# Patient Record
Sex: Female | Born: 1960 | Race: White | Hispanic: No | Marital: Married | Smoking: Never smoker
Health system: Southern US, Community
[De-identification: ages and names within clinical notes are randomized; demographics above are authoritative.]

## PROBLEM LIST (undated history)

## (undated) DIAGNOSIS — M1990 Unspecified osteoarthritis, unspecified site: Secondary | ICD-10-CM

## (undated) DIAGNOSIS — R112 Nausea with vomiting, unspecified: Secondary | ICD-10-CM

## (undated) DIAGNOSIS — T8859XA Other complications of anesthesia, initial encounter: Secondary | ICD-10-CM

---

## 1985-06-22 HISTORY — PX: RHINOPLASTY: SUR1284

## 2013-04-12 DIAGNOSIS — J309 Allergic rhinitis, unspecified: Secondary | ICD-10-CM | POA: Insufficient documentation

## 2013-04-12 DIAGNOSIS — J3089 Other allergic rhinitis: Secondary | ICD-10-CM | POA: Insufficient documentation

## 2016-04-16 LAB — HM DEXA SCAN: HM Dexa Scan: NORMAL

## 2017-02-18 LAB — HM PAP SMEAR: HM Pap smear: NORMAL

## 2019-03-06 LAB — LIPID PANEL
Cholesterol: 239 — AB (ref 0–200)
HDL: 65 (ref 35–70)
LDL Cholesterol: 137
Triglycerides: 184 — AB (ref 40–160)

## 2019-03-06 LAB — VITAMIN D 25 HYDROXY (VIT D DEFICIENCY, FRACTURES): Vit D, 25-Hydroxy: 30.9

## 2019-03-06 LAB — CBC AND DIFFERENTIAL
HCT: 43 (ref 36–46)
Hemoglobin: 14.9 (ref 12.0–16.0)
Platelets: 254 (ref 150–399)
WBC: 7.3

## 2019-03-06 LAB — HEPATIC FUNCTION PANEL: Bilirubin, Direct: 0.1

## 2019-03-06 LAB — CBC: RBC: 4.61 (ref 3.87–5.11)

## 2019-03-16 LAB — BASIC METABOLIC PANEL
BUN: 21 (ref 4–21)
CO2: 28 — AB (ref 13–22)
Chloride: 105 (ref 99–108)
Creatinine: 0.9 (ref <=1.1)
Glucose: 98
Potassium: 4.2 (ref 3.4–5.3)
Sodium: 141 (ref 137–147)

## 2019-03-16 LAB — COMPREHENSIVE METABOLIC PANEL: Calcium: 9.7 (ref 8.7–10.7)

## 2019-05-17 LAB — HM MAMMOGRAPHY

## 2019-06-20 LAB — HM COLONOSCOPY

## 2020-03-20 ENCOUNTER — Ambulatory Visit: Payer: 59 | Admitting: Physician Assistant

## 2020-03-26 ENCOUNTER — Other Ambulatory Visit: Payer: 59

## 2020-03-26 ENCOUNTER — Ambulatory Visit (INDEPENDENT_AMBULATORY_CARE_PROVIDER_SITE_OTHER): Payer: 59 | Admitting: Physician Assistant

## 2020-03-26 ENCOUNTER — Other Ambulatory Visit: Payer: Self-pay

## 2020-03-26 ENCOUNTER — Encounter: Payer: Self-pay | Admitting: Physician Assistant

## 2020-03-26 VITALS — BP 126/80 | HR 82 | Temp 98.3°F | Ht 67.0 in | Wt 163.0 lb

## 2020-03-26 DIAGNOSIS — L819 Disorder of pigmentation, unspecified: Secondary | ICD-10-CM | POA: Diagnosis not present

## 2020-03-26 DIAGNOSIS — Z0000 Encounter for general adult medical examination without abnormal findings: Secondary | ICD-10-CM

## 2020-03-26 NOTE — Progress Notes (Signed)
Patient presents to clinic today to establish care. Would like CPE today if time allows.   Acute Concerns: Patient would like referral to a local Dermatologist. Has history of precancerous skin lesions so has a yearly skin examination.   Health Maintenance: Immunizations -- UTD per patient. Records requested.  Colonoscopy -- recall in 7 years. Mammogram -- 2020. Due for updated mammogram. Denies concerns. PAP -- UTD  History reviewed. No pertinent past medical history.  History reviewed. No pertinent surgical history.  Current Outpatient Medications on File Prior to Visit  Medication Sig Dispense Refill  . acyclovir (ZOVIRAX) 200 MG capsule Take by mouth.    . loratadine (CLARITIN) 10 MG tablet Take by mouth.    . naproxen sodium (ALEVE) 220 MG tablet Take by mouth.     No current facility-administered medications on file prior to visit.    No Known Allergies  Family History  Problem Relation Age of Onset  . Prostate cancer Father   . Breast cancer Maternal Grandmother 6    Social History   Socioeconomic History  . Marital status: Married    Spouse name: Not on file  . Number of children: Not on file  . Years of education: Not on file  . Highest education level: Not on file  Occupational History  . Not on file  Tobacco Use  . Smoking status: Never Smoker  . Smokeless tobacco: Never Used  Substance and Sexual Activity  . Alcohol use: Yes    Alcohol/week: 7.0 standard drinks    Types: 7 Glasses of wine per week  . Drug use: Never  . Sexual activity: Yes  Other Topics Concern  . Not on file  Social History Narrative  . Not on file   Social Determinants of Health   Financial Resource Strain:   . Difficulty of Paying Living Expenses: Not on file  Food Insecurity:   . Worried About Programme researcher, broadcasting/film/video in the Last Year: Not on file  . Ran Out of Food in the Last Year: Not on file  Transportation Needs:   . Lack of Transportation (Medical): Not on file  .  Lack of Transportation (Non-Medical): Not on file  Physical Activity:   . Days of Exercise per Week: Not on file  . Minutes of Exercise per Session: Not on file  Stress:   . Feeling of Stress : Not on file  Social Connections:   . Frequency of Communication with Friends and Family: Not on file  . Frequency of Social Gatherings with Friends and Family: Not on file  . Attends Religious Services: Not on file  . Active Member of Clubs or Organizations: Not on file  . Attends Banker Meetings: Not on file  . Marital Status: Not on file  Intimate Partner Violence:   . Fear of Current or Ex-Partner: Not on file  . Emotionally Abused: Not on file  . Physically Abused: Not on file  . Sexually Abused: Not on file   Review of Systems  Constitutional: Negative for fever and weight loss.  HENT: Negative for ear discharge, ear pain, hearing loss and tinnitus.   Eyes: Negative for blurred vision, double vision, photophobia and pain.  Respiratory: Negative for cough and shortness of breath.   Cardiovascular: Negative for chest pain and palpitations.  Gastrointestinal: Negative for abdominal pain, blood in stool, constipation, diarrhea, heartburn, melena, nausea and vomiting.  Genitourinary: Negative for dysuria, flank pain, frequency, hematuria and urgency.  Musculoskeletal: Negative for falls.  Neurological: Negative for dizziness, loss of consciousness and headaches.  Endo/Heme/Allergies: Negative for environmental allergies.  Psychiatric/Behavioral: Negative for depression, hallucinations, substance abuse and suicidal ideas. The patient is not nervous/anxious and does not have insomnia.    BP 126/80   Pulse 82   Temp 98.3 F (36.8 C) (Temporal)   Ht 5\' 7"  (1.702 m)   Wt 163 lb (73.9 kg)   SpO2 99%   BMI 25.53 kg/m   Physical Exam Vitals reviewed.  Constitutional:      Appearance: Normal appearance.  HENT:     Head: Normocephalic and atraumatic.     Right Ear: Tympanic  membrane, ear canal and external ear normal.     Left Ear: Tympanic membrane, ear canal and external ear normal.     Nose: Nose normal. No mucosal edema.     Mouth/Throat:     Pharynx: Uvula midline. No oropharyngeal exudate or posterior oropharyngeal erythema.  Eyes:     Conjunctiva/sclera: Conjunctivae normal.     Pupils: Pupils are equal, round, and reactive to light.  Neck:     Thyroid: No thyromegaly.  Cardiovascular:     Rate and Rhythm: Normal rate and regular rhythm.     Heart sounds: Normal heart sounds.  Pulmonary:     Effort: Pulmonary effort is normal. No respiratory distress.     Breath sounds: Normal breath sounds. No wheezing or rales.  Abdominal:     General: Bowel sounds are normal. There is no distension.     Palpations: Abdomen is soft. There is no mass.     Tenderness: There is no abdominal tenderness. There is no guarding or rebound.  Musculoskeletal:     Cervical back: Neck supple.  Lymphadenopathy:     Cervical: No cervical adenopathy.  Skin:    General: Skin is warm and dry.     Findings: No rash.  Neurological:     Mental Status: She is alert and oriented to person, place, and time.     Cranial Nerves: No cranial nerve deficit.    Assessment/Plan: 1. Visit for preventive health examination Depression screen negative. Health Maintenance reviewed. Preventive schedule discussed and handout given in AVS. Will obtain fasting labs today.  - CBC w/Diff; Future - Comprehensive metabolic panel; Future - Lipid panel; Future - Hemoglobin A1c; Future  This visit occurred during the SARS-CoV-2 public health emergency.  Safety protocols were in place, including screening questions prior to the visit, additional usage of staff PPE, and extensive cleaning of exam room while observing appropriate contact time as indicated for disinfecting solutions.      , PA-C

## 2020-03-26 NOTE — Patient Instructions (Signed)
Please go to the front desk to see about being scheduled for labs today at [facility].  Our office will call you with your results unless you have chosen to receive results via MyChart. If your blood work is normal we will follow-up each year for physicals and as scheduled for chronic medical problems. If anything is abnormal we will treat accordingly and get you in for a follow-up.  Consider a daily Tuermic supplement, 500 mg or less.  Continue use of Voltaren.  I have placed a referral for you to Dermatology. You should be contacted by specialist office within 7-10 days. If not, please call or send me a MyChart message.   It was very nice meeting you today. Welcome to [facility]!   Preventive Care 46-62 Years Old, Female Preventive care refers to visits with your health care provider and lifestyle choices that can promote health and wellness. This includes:  A yearly physical exam. This may also be called an annual well check.  Regular dental visits and eye exams.  Immunizations.  Screening for certain conditions.  Healthy lifestyle choices, such as eating a healthy diet, getting regular exercise, not using drugs or products that contain nicotine and tobacco, and limiting alcohol use. What can I expect for my preventive care visit? Physical exam Your health care provider will check your:  Height and weight. This may be used to calculate body mass index (BMI), which tells if you are at a healthy weight.  Heart rate and blood pressure.  Skin for abnormal spots. Counseling Your health care provider may ask you questions about your:  Alcohol, tobacco, and drug use.  Emotional well-being.  Home and relationship well-being.  Sexual activity.  Eating habits.  Work and work Statistician.  Method of birth control.  Menstrual cycle.  Pregnancy history. What immunizations do I need?  Influenza (flu) vaccine  This is recommended every year. Tetanus, diphtheria, and  pertussis (Tdap) vaccine  You may need a Td booster every 10 years. Varicella (chickenpox) vaccine  You may need this if you have not been vaccinated. Zoster (shingles) vaccine  You may need this after age 30. Measles, mumps, and rubella (MMR) vaccine  You may need at least one dose of MMR if you were born in 1957 or later. You may also need a second dose. Pneumococcal conjugate (PCV13) vaccine  You may need this if you have certain conditions and were not previously vaccinated. Pneumococcal polysaccharide (PPSV23) vaccine  You may need one or two doses if you smoke cigarettes or if you have certain conditions. Meningococcal conjugate (MenACWY) vaccine  You may need this if you have certain conditions. Hepatitis A vaccine  You may need this if you have certain conditions or if you travel or work in places where you may be exposed to hepatitis A. Hepatitis B vaccine  You may need this if you have certain conditions or if you travel or work in places where you may be exposed to hepatitis B. Haemophilus influenzae type b (Hib) vaccine  You may need this if you have certain conditions. Human papillomavirus (HPV) vaccine  If recommended by your health care provider, you may need three doses over 6 months. You may receive vaccines as individual doses or as more than one vaccine together in one shot (combination vaccines). Talk with your health care provider about the risks and benefits of combination vaccines. What tests do I need? Blood tests  Lipid and cholesterol levels. These may be checked every 5 years, or  more frequently if you are over 58 years old.  Hepatitis C test.  Hepatitis B test. Screening  Lung cancer screening. You may have this screening every year starting at age 33 if you have a 30-pack-year history of smoking and currently smoke or have quit within the past 15 years.  Colorectal cancer screening. All adults should have this screening starting at age 1 and  continuing until age 62. Your health care provider may recommend screening at age 3 if you are at increased risk. You will have tests every 1-10 years, depending on your results and the type of screening test.  Diabetes screening. This is done by checking your blood sugar (glucose) after you have not eaten for a while (fasting). You may have this done every 1-3 years.  Mammogram. This may be done every 1-2 years. Talk with your health care provider about when you should start having regular mammograms. This may depend on whether you have a family history of breast cancer.  BRCA-related cancer screening. This may be done if you have a family history of breast, ovarian, tubal, or peritoneal cancers.  Pelvic exam and Pap test. This may be done every 3 years starting at age 8. Starting at age 105, this may be done every 5 years if you have a Pap test in combination with an HPV test. Other tests  Sexually transmitted disease (STD) testing.  Bone density scan. This is done to screen for osteoporosis. You may have this scan if you are at high risk for osteoporosis. Follow these instructions at home: Eating and drinking  Eat a diet that includes fresh fruits and vegetables, whole grains, lean protein, and low-fat dairy.  Take vitamin and mineral supplements as recommended by your health care provider.  Do not drink alcohol if: ? Your health care provider tells you not to drink. ? You are pregnant, may be pregnant, or are planning to become pregnant.  If you drink alcohol: ? Limit how much you have to 0-1 drink a day. ? Be aware of how much alcohol is in your drink. In the [place] one drink equals one 12 oz bottle of beer (355 mL), one 5 oz glass of wine (148 mL), or one 1 oz glass of hard liquor (44 mL). Lifestyle  Take daily care of your teeth and gums.  Stay active. Exercise for at least 30 minutes on 5 or more days each week.  Do not use any products that contain nicotine or tobacco,  such as cigarettes, e-cigarettes, and chewing tobacco. If you need help quitting, ask your health care provider.  If you are sexually active, practice safe sex. Use a condom or other form of birth control (contraception) in order to prevent pregnancy and STIs (sexually transmitted infections).  If told by your health care provider, take low-dose aspirin daily starting at age 15. What's next?  Visit your health care provider once a year for a well check visit.  Ask your health care provider how often you should have your eyes and teeth checked.  Stay up to date on all vaccines. This information is not intended to replace advice given to you by your health care provider. Make sure you discuss any questions you have with your health care provider. Document Revised: 02/17/2018 Document Reviewed: 02/17/2018 Elsevier Patient Education  [employer]. .

## 2020-03-27 LAB — COMPREHENSIVE METABOLIC PANEL
AG Ratio: 2 (calc) (ref 1.0–2.5)
ALT: 12 U/L (ref 6–29)
AST: 15 U/L (ref 10–35)
Albumin: 4.7 g/dL (ref 3.6–5.1)
Alkaline phosphatase (APISO): 62 U/L (ref 37–153)
BUN: 14 mg/dL (ref 7–25)
CO2: 31 mmol/L (ref 20–32)
Calcium: 9.9 mg/dL (ref 8.6–10.4)
Chloride: 103 mmol/L (ref 98–110)
Creat: 0.72 mg/dL (ref 0.50–0.99)
Globulin: 2.4 g/dL (calc) (ref 1.9–3.7)
Glucose, Bld: 91 mg/dL (ref 65–99)
Potassium: 4.4 mmol/L (ref 3.5–5.3)
Sodium: 139 mmol/L (ref 135–146)
Total Bilirubin: 0.6 mg/dL (ref 0.2–1.2)
Total Protein: 7.1 g/dL (ref 6.1–8.1)

## 2020-03-27 LAB — LIPID PANEL
Cholesterol: 234 mg/dL — ABNORMAL HIGH (ref <200)
HDL: 63 mg/dL (ref >=50)
LDL Cholesterol (Calc): 141 mg/dL (calc) — ABNORMAL HIGH
Non-HDL Cholesterol (Calc): 171 mg/dL (calc) — ABNORMAL HIGH (ref <130)
Total CHOL/HDL Ratio: 3.7 (calc) (ref <5.0)
Triglycerides: 164 mg/dL — ABNORMAL HIGH (ref <150)

## 2020-03-27 LAB — CBC WITH DIFFERENTIAL/PLATELET
Absolute Monocytes: 630 cells/uL (ref 200–950)
Basophils Absolute: 69 cells/uL (ref 0–200)
Basophils Relative: 1.1 %
Eosinophils Absolute: 88 cells/uL (ref 15–500)
Eosinophils Relative: 1.4 %
HCT: 42 % (ref 35.0–45.0)
Hemoglobin: 14.2 g/dL (ref 11.7–15.5)
Lymphs Abs: 2003 cells/uL (ref 850–3900)
MCH: 31.8 pg (ref 27.0–33.0)
MCHC: 33.8 g/dL (ref 32.0–36.0)
MCV: 94.2 fL (ref 80.0–100.0)
MPV: 9.7 fL (ref 7.5–12.5)
Monocytes Relative: 10 %
Neutro Abs: 3509 cells/uL (ref 1500–7800)
Neutrophils Relative %: 55.7 %
Platelets: 340 10*3/uL (ref 140–400)
RBC: 4.46 10*6/uL (ref 3.80–5.10)
RDW: 11.3 % (ref 11.0–15.0)
Total Lymphocyte: 31.8 %
WBC: 6.3 10*3/uL (ref 3.8–10.8)

## 2020-03-27 LAB — HEMOGLOBIN A1C
Hgb A1c MFr Bld: 5 % of total Hgb (ref <5.7)
Mean Plasma Glucose: 97 (calc)
eAG (mmol/L): 5.4 (calc)

## 2020-03-28 ENCOUNTER — Encounter: Payer: Self-pay | Admitting: Physician Assistant

## 2020-04-02 ENCOUNTER — Encounter: Payer: Self-pay | Admitting: Emergency Medicine

## 2020-04-09 ENCOUNTER — Encounter: Payer: Self-pay | Admitting: Physician Assistant

## 2020-04-09 DIAGNOSIS — Z1231 Encounter for screening mammogram for malignant neoplasm of breast: Secondary | ICD-10-CM

## 2020-05-21 ENCOUNTER — Ambulatory Visit
Admission: RE | Admit: 2020-05-21 | Discharge: 2020-05-21 | Disposition: A | Discharge status: Home / Self Care | Payer: 59 | Source: Ambulatory Visit | Attending: Physician Assistant | Admitting: Physician Assistant

## 2020-05-21 ENCOUNTER — Other Ambulatory Visit: Payer: Self-pay

## 2020-05-21 DIAGNOSIS — Z1231 Encounter for screening mammogram for malignant neoplasm of breast: Secondary | ICD-10-CM

## 2020-06-13 ENCOUNTER — Other Ambulatory Visit: Payer: 59

## 2020-06-26 ENCOUNTER — Other Ambulatory Visit: Payer: Self-pay | Admitting: Physician Assistant

## 2020-06-26 ENCOUNTER — Encounter: Payer: Self-pay | Admitting: Physician Assistant

## 2020-06-26 ENCOUNTER — Other Ambulatory Visit: Payer: Self-pay

## 2020-06-26 ENCOUNTER — Telehealth (INDEPENDENT_AMBULATORY_CARE_PROVIDER_SITE_OTHER): Payer: 59 | Admitting: Physician Assistant

## 2020-06-26 VITALS — Temp 97.1°F

## 2020-06-26 DIAGNOSIS — J0100 Acute maxillary sinusitis, unspecified: Secondary | ICD-10-CM | POA: Diagnosis not present

## 2020-06-26 MED ORDER — AMOXICILLIN-POT CLAVULANATE 875-125 MG PO TABS: 1.0000 | ORAL_TABLET | Freq: Two times a day (BID) | ORAL | 0 refills | Status: DC

## 2020-06-26 MED ORDER — AMOXICILLIN-POT CLAVULANATE 875-125 MG PO TABS
1.0000 | ORAL_TABLET | Freq: Two times a day (BID) | ORAL | 0 refills | Status: DC
Start: 2020-06-26 — End: 2020-07-08

## 2020-06-26 MED ORDER — FLUTICASONE PROPIONATE 50 MCG/ACT NA SUSP: 2.0000 | Freq: Every day | NASAL | 0 refills | Status: DC

## 2020-06-26 NOTE — Progress Notes (Signed)
I have discussed the procedure for the virtual visit with the patient who has given consent to proceed with assessment and treatment.   Anabeth Chilcott S Zerah Hilyer, CMA     

## 2020-06-26 NOTE — Progress Notes (Signed)
   Virtual Visit via Video   I connected with patient on 06/26/20 at  9:30 AM EST by a video enabled telemedicine application and verified that I am speaking with the correct person using two identifiers.  Location patient: Home Location provider: [place] [facility], Office Persons participating in the virtual visit: Patient, Provider, CMA (Patina Moore)  I discussed the limitations of evaluation and management by telemedicine and the availability of in person appointments. The patient expressed understanding and agreed to proceed.  Subjective:   HPI:   Patient presents via Careg[facility] today c/o URI symptoms starting this past Saturday after playing golf in windy cold weather. Started with mild nasal congestion, post nasal drip and mild cough. Now with sinus pain, ear pain and tooth pain. Denies fevers, chills, malaise, loss of taste or smell.  Denies recent travel or sick contact.  Is up-to-date on Covid vaccinations.  Notes she did take a home test to be cautious and this was negative.  He is taking DayQuil and Claritin for symptoms with only some relief.   ROS:   See pertinent positives and negatives per HPI.  Patient Active Problem List   Diagnosis Date Noted  . Allergic rhinitis 04/12/2013    Social History   Tobacco Use  . Smoking status: Never Smoker  . Smokeless tobacco: Never Used  Substance Use Topics  . Alcohol use: Yes    Alcohol/week: 7.0 standard drinks    Types: 7 Glasses of wine per week    Current Outpatient Medications:  .  acyclovir (ZOVIRAX) 200 MG capsule, Take by mouth., Disp: , Rfl:  .  loratadine (CLARITIN) 10 MG tablet, Take by mouth., Disp: , Rfl:  .  naproxen sodium (ALEVE) 220 MG tablet, Take by mouth., Disp: , Rfl:   No Known Allergies  Objective:   Temp (!) 97.1 F (36.2 C) (Oral)   Patient is well-developed, well-nourished in no acute distress.  Resting comfortably at home.  Head is normocephalic, atraumatic.  No labored breathing.   Speech is clear and coherent with logical content.  Patient is alert and oriented at baseline.  + TTP maxillary sinuses bl.  Assessment and Plan:   1. Acute non-recurrent maxillary sinusitis After being outside in the very cold windy day.  No sick contacts.  No known Covid exposure.  Negative Covid test.  Symptoms are consistent with a maxillary sinusitis.  We will have her continue staying hydrated and getting plenty of rest.  Vitamin regimen reviewed for her start OTC.  Continue Claritin and NyQuil/DayQuil.  We will add on Augmentin and Flonase.  Patient also encouraged to start a saline nasal rinse.  Strict return precautions discussed with patient who voiced understanding and agreement with the plan.  A copy of instructions were sent to the patient's MyChart. - amoxicillin-clavulanate (AUGMENTIN) 875-125 MG tablet; Take 1 tablet by mouth 2 (two) times daily.  Dispense: 14 tablet; Refill: 0 - fluticasone (FLONASE) 50 MCG/ACT nasal spray; Place 2 sprays into both nostrils daily.  Dispense: 16 g; Refill: 0    Piedad Climes, PA-C 06/26/2020

## 2020-06-26 NOTE — Patient Instructions (Signed)
Instructions sent to patients MyChart.

## 2020-06-29 ENCOUNTER — Encounter: Payer: Self-pay | Admitting: Physician Assistant

## 2020-07-08 ENCOUNTER — Other Ambulatory Visit: Payer: Self-pay

## 2020-07-08 ENCOUNTER — Telehealth (INDEPENDENT_AMBULATORY_CARE_PROVIDER_SITE_OTHER): Payer: 59 | Admitting: Physician Assistant

## 2020-07-08 ENCOUNTER — Encounter: Payer: Self-pay | Admitting: Physician Assistant

## 2020-07-08 DIAGNOSIS — R300 Dysuria: Secondary | ICD-10-CM

## 2020-07-08 MED ORDER — FLUCONAZOLE 150 MG PO TABS: 150.0000 mg | ORAL_TABLET | Freq: Once | ORAL | 0 refills | Status: AC

## 2020-07-08 MED ORDER — NITROFURANTOIN MONOHYD MACRO 100 MG PO CAPS: 100.0000 mg | ORAL_CAPSULE | Freq: Two times a day (BID) | ORAL | 0 refills | Status: DC

## 2020-07-08 NOTE — Patient Instructions (Signed)
Instructions sent to patients MyChart.

## 2020-07-08 NOTE — Progress Notes (Signed)
   Virtual Visit via Video   I connected with patient on 07/08/20 at  8:30 AM EST by a video enabled telemedicine application and verified that I am speaking with the correct person using two identifiers.  Location patient: Home Location provider: [place] [facility], Office Persons participating in the virtual visit: Patient, Provider, CMA (Patina Moore)  I discussed the limitations of evaluation and management by telemedicine and the availability of in person appointments. The patient expressed understanding and agreed to proceed.  Subjective:   HPI:   Patient presents via Caregility today c/o 1 day of urinary urgency, frequency, hesitance and dysuria.. Denies hematuria, nausea/vomiting, back pain no flank pain. Denies fever.  Notes very mild vaginal itching. Has been trying to stay well-hydrated.   ROS:   See pertinent positives and negatives per HPI.  Patient Active Problem List   Diagnosis Date Noted  . Allergic rhinitis 04/12/2013    Social History   Tobacco Use  . Smoking status: Never Smoker  . Smokeless tobacco: Never Used  Substance Use Topics  . Alcohol use: Yes    Alcohol/week: 7.0 standard drinks    Types: 7 Glasses of wine per week    Current Outpatient Medications:  .  acyclovir (ZOVIRAX) 200 MG capsule, Take by mouth., Disp: , Rfl:  .  fluticasone (FLONASE) 50 MCG/ACT nasal spray, Place 2 sprays into both nostrils daily., Disp: 16 g, Rfl: 0 .  loratadine (CLARITIN) 10 MG tablet, Take by mouth., Disp: , Rfl:  .  naproxen sodium (ALEVE) 220 MG tablet, Take by mouth., Disp: , Rfl:   No Known Allergies  Objective:   There were no vitals taken for this visit.  Patient is well-developed, well-nourished in no acute distress.  Resting comfortably at home.  Head is normocephalic, atraumatic.  No labored breathing.  Speech is clear and coherent with logical content.  Patient is alert and oriented at baseline.   Assessment and Plan:   1. Dysuria Suspect  mild uncomplicated cystitis. Giving lack of alarm signs/symptoms will proceed with empiric treatment via video visit with Macrobid. Increase fluids. Rest. Avoid caffeine. Start probiotic. Will add one tablet of Diflucan giving itching and concern for mild yeast. Strict return precautions reviewed with patient.     Piedad Climes, PA-C 07/08/2020

## 2020-07-08 NOTE — Progress Notes (Signed)
I have discussed the procedure for the virtual visit with the patient who has given consent to proceed with assessment and treatment.   Zania Kalisz S Roe Wilner, CMA     

## 2020-07-10 ENCOUNTER — Ambulatory Visit: Payer: 59 | Admitting: Physician Assistant

## 2020-07-23 ENCOUNTER — Encounter: Payer: Self-pay | Admitting: Physician Assistant

## 2020-07-23 ENCOUNTER — Other Ambulatory Visit: Payer: Self-pay

## 2020-07-23 ENCOUNTER — Other Ambulatory Visit: Payer: 59

## 2020-07-23 ENCOUNTER — Telehealth (INDEPENDENT_AMBULATORY_CARE_PROVIDER_SITE_OTHER): Payer: 59 | Admitting: Physician Assistant

## 2020-07-23 ENCOUNTER — Encounter (INDEPENDENT_AMBULATORY_CARE_PROVIDER_SITE_OTHER): Payer: Self-pay

## 2020-07-23 VITALS — Temp 98.2°F

## 2020-07-23 DIAGNOSIS — R300 Dysuria: Secondary | ICD-10-CM

## 2020-07-23 LAB — POCT URINALYSIS DIPSTICK
Bilirubin, UA: NEGATIVE
Glucose, UA: NEGATIVE
Ketones, UA: NEGATIVE
Nitrite, UA: NEGATIVE
Protein, UA: NEGATIVE
Spec Grav, UA: 1.015 (ref 1.010–1.025)
Urobilinogen, UA: 0.2 E.U./dL
pH, UA: 7 (ref 5.0–8.0)

## 2020-07-23 NOTE — Progress Notes (Signed)
I have discussed the procedure for the virtual visit with the patient who has given consent to proceed with assessment and treatment.   Camyra Vaeth S Ezriel Boffa, CMA     

## 2020-07-23 NOTE — Progress Notes (Signed)
Patient ID: Theresa Rivera, female   DOB: 1961/04/11, 60 y.o.   MRN: [id number]   Virtual Visit via Video Note  I connected with Theresa Rivera on 07/23/20 at  8:00 AM EST by a video enabled telemedicine application and verified that I am speaking with the correct person using two identifiers.  Location: Patient: home Provider: office   I discussed the limitations of evaluation and management by telemedicine and the availability of in person appointments. The patient expressed understanding and agreed to proceed. Only the patient and myself were present for today's video call.   History of Present Illness:  Patient is a 60 year old female presenting via virtual video visit today for complaints of dysuria.  She was treated approximately 2 weeks ago by another video visit for similar symptoms with Macrobid.  She states that within 48 hours her symptoms had resolved after treatment that time.  Her symptoms just started again last night with burning, some hematuria, and frequency.   She denies any fever, back pain, abdominal pain, or nausea and vomiting. She did have some chills last night.  A urine specimen was not collected at last encounter.  Observations/Objective:   Gen: Awake, alert, no acute distress Resp: Breathing is even and non-labored Psych: calm/pleasant demeanor Neuro: Alert and Oriented x 3, + facial symmetry, speech is clear.   Assessment and Plan:  Dysuria -I suspect that she may have an unresolved acute cystitis.  I would like to obtain a urine sample for urinalysis and culture this time.  She is going to come by the office today and provide a sample.  Once the culture has been resulted then we will treat with antibiotics if needed.  She is going to take over-the-counter AZO and push fluids in the meantime.  She will seek treatment in our office if she acutely worsens.  Follow Up Instructions:    I discussed the assessment and treatment plan with the  patient. The patient was provided an opportunity to ask questions and all were answered. The patient agreed with the plan and demonstrated an understanding of the instructions.   The patient was advised to call back or seek an in-person evaluation if the symptoms worsen or if the condition fails to improve as anticipated.  Arianah Torgeson M Tatem Holsonback, PA-C

## 2020-07-23 NOTE — Patient Instructions (Signed)
I suspect that you may not have another bladder infection.  I will call with results when the culture is back.  Then we will treat with antibiotics if needed.  Please take the AZO and push fluids in the meantime.

## 2020-07-25 ENCOUNTER — Other Ambulatory Visit: Payer: Self-pay | Admitting: *Deleted

## 2020-07-25 LAB — URINE CULTURE
MICRO NUMBER:: 11481083
SPECIMEN QUALITY:: ADEQUATE

## 2020-07-25 MED ORDER — NITROFURANTOIN MONOHYD MACRO 100 MG PO CAPS: 100.0000 mg | ORAL_CAPSULE | Freq: Two times a day (BID) | ORAL | 0 refills | Status: DC

## 2021-01-24 ENCOUNTER — Encounter: Payer: 59 | Admitting: Family Medicine

## 2021-02-01 ENCOUNTER — Encounter: Payer: Self-pay | Admitting: Family Medicine

## 2021-02-03 ENCOUNTER — Other Ambulatory Visit: Payer: Self-pay

## 2021-02-03 DIAGNOSIS — E785 Hyperlipidemia, unspecified: Secondary | ICD-10-CM

## 2021-02-03 DIAGNOSIS — Z0000 Encounter for general adult medical examination without abnormal findings: Secondary | ICD-10-CM

## 2021-02-10 ENCOUNTER — Ambulatory Visit: Payer: 59 | Admitting: Family Medicine

## 2021-04-15 ENCOUNTER — Telehealth: Payer: Self-pay

## 2021-04-15 ENCOUNTER — Other Ambulatory Visit: Payer: Self-pay | Admitting: Family Medicine

## 2021-04-15 DIAGNOSIS — Z1231 Encounter for screening mammogram for malignant neoplasm of breast: Secondary | ICD-10-CM

## 2021-04-15 NOTE — Telephone Encounter (Signed)
Pt called wanting labs done prior to her appt for 10/28. Thanvi is scheduled for a TOC with Jimmey Ralph and is also requesting a physical. Can labs be ordered for 10/27? Pleas Advise.

## 2021-04-16 NOTE — Telephone Encounter (Signed)
Unable to do placed order, Need to have a TOC first

## 2021-04-17 ENCOUNTER — Encounter: Payer: Self-pay | Admitting: Family Medicine

## 2021-04-17 NOTE — Telephone Encounter (Signed)
Pt is aware.  

## 2021-04-18 ENCOUNTER — Other Ambulatory Visit: Payer: Self-pay

## 2021-04-18 ENCOUNTER — Ambulatory Visit (INDEPENDENT_AMBULATORY_CARE_PROVIDER_SITE_OTHER): Payer: 59 | Admitting: Family Medicine

## 2021-04-18 ENCOUNTER — Encounter: Payer: Self-pay | Admitting: Family Medicine

## 2021-04-18 VITALS — BP 127/80 | HR 75 | Temp 98.3°F | Ht 67.0 in | Wt 165.2 lb

## 2021-04-18 DIAGNOSIS — E785 Hyperlipidemia, unspecified: Secondary | ICD-10-CM | POA: Diagnosis not present

## 2021-04-18 DIAGNOSIS — B001 Herpesviral vesicular dermatitis: Secondary | ICD-10-CM | POA: Diagnosis not present

## 2021-04-18 DIAGNOSIS — Z0001 Encounter for general adult medical examination with abnormal findings: Secondary | ICD-10-CM

## 2021-04-18 DIAGNOSIS — R739 Hyperglycemia, unspecified: Secondary | ICD-10-CM | POA: Diagnosis not present

## 2021-04-18 DIAGNOSIS — E663 Overweight: Secondary | ICD-10-CM

## 2021-04-18 DIAGNOSIS — Z6825 Body mass index (BMI) 25.0-25.9, adult: Secondary | ICD-10-CM

## 2021-04-18 LAB — HEMOGLOBIN A1C: Hgb A1c MFr Bld: 5.3 % (ref 4.6–6.5)

## 2021-04-18 LAB — COMPREHENSIVE METABOLIC PANEL
ALT: 17 U/L (ref 0–35)
AST: 20 U/L (ref 0–37)
Albumin: 4.7 g/dL (ref 3.5–5.2)
Alkaline Phosphatase: 58 U/L (ref 39–117)
BUN: 14 mg/dL (ref 6–23)
CO2: 31 mEq/L (ref 19–32)
Calcium: 10 mg/dL (ref 8.4–10.5)
Chloride: 103 mEq/L (ref 96–112)
Creatinine, Ser: 0.91 mg/dL (ref 0.40–1.20)
GFR: 67.77 mL/min (ref >60.00)
Glucose, Bld: 100 mg/dL — ABNORMAL HIGH (ref 70–99)
Potassium: 5.2 mEq/L — ABNORMAL HIGH (ref 3.5–5.1)
Sodium: 141 mEq/L (ref 135–145)
Total Bilirubin: 0.9 mg/dL (ref 0.2–1.2)
Total Protein: 7.7 g/dL (ref 6.0–8.3)

## 2021-04-18 LAB — CBC
HCT: 42.6 % (ref 36.0–46.0)
Hemoglobin: 14.7 g/dL (ref 12.0–15.0)
MCHC: 34.6 g/dL (ref 30.0–36.0)
MCV: 92.6 fl (ref 78.0–100.0)
Platelets: 290 10*3/uL (ref 150.0–400.0)
RBC: 4.6 Mil/uL (ref 3.87–5.11)
RDW: 12.1 % (ref 11.5–15.5)
WBC: 5.4 10*3/uL (ref 4.0–10.5)

## 2021-04-18 LAB — LIPID PANEL
Cholesterol: 229 mg/dL — ABNORMAL HIGH (ref 0–200)
HDL: 64.5 mg/dL (ref >39.00)
LDL Cholesterol: 147 mg/dL — ABNORMAL HIGH (ref 0–99)
NonHDL: 164.7
Total CHOL/HDL Ratio: 4
Triglycerides: 90 mg/dL (ref 0.0–149.0)
VLDL: 18 mg/dL (ref 0.0–40.0)

## 2021-04-18 LAB — TSH: TSH: 1.33 u[IU]/mL (ref 0.35–5.50)

## 2021-04-18 MED ORDER — ACYCLOVIR 200 MG PO CAPS: 800.0000 mg | ORAL_CAPSULE | Freq: Two times a day (BID) | ORAL | 1 refills | Status: AC

## 2021-04-18 NOTE — Assessment & Plan Note (Signed)
We will refill acyclovir.  She uses as needed for outbreaks.

## 2021-04-18 NOTE — Assessment & Plan Note (Signed)
Check labs.  Continue lifestyle modifications. °

## 2021-04-18 NOTE — Patient Instructions (Signed)
It was very nice to see you today!  We will check blood work.  Keep up on diet and exercise.  We will see back in year for your next physical.  Please come back to see [place] sooner if needed.   Take care, Dr Jerline Pain  PLEASE NOTE:  If you had any lab tests please let us know if you have not heard back within a few days. You may see your results on mychart before we have a chance to review them but we will give you a call once they are reviewed by [place]. If we ordered any referrals today, please let us know if you have not heard from their office within the next week.   Please try these tips to maintain a healthy lifestyle:  Eat at least 3 REAL meals and 1-2 snacks per day.  Aim for no more than 5 hours between eating.  If you eat breakfast, please do so within one hour of getting up.   Each meal should contain half fruits/vegetables, one quarter protein, and one quarter carbs (no bigger than a computer mouse)  Cut down on sweet beverages. This includes juice, soda, and sweet tea.   Drink at least 1 glass of water with each meal and aim for at least 8 glasses per day  Exercise at least 150 minutes every week.    Preventive Care 23-26 Years Old, Female Preventive care refers to lifestyle choices and visits with your health care provider that can promote health and wellness. This includes: A yearly physical exam. This is also called an annual wellness visit. Regular dental and eye exams. Immunizations. Screening for certain conditions. Healthy lifestyle choices, such as: Eating a healthy diet. Getting regular exercise. Not using drugs or products that contain nicotine and tobacco. Limiting alcohol use. What can I expect for my preventive care visit? Physical exam Your health care provider will check your: Height and weight. These may be used to calculate your BMI (body mass index). BMI is a measurement that tells if you are at a healthy weight. Heart rate and blood pressure. Body  temperature. Skin for abnormal spots. Counseling Your health care provider may ask you questions about your: Past medical problems. Family's medical history. Alcohol, tobacco, and drug use. Emotional well-being. Home life and relationship well-being. Sexual activity. Diet, exercise, and sleep habits. Work and work Statistician. Access to firearms. Method of birth control. Menstrual cycle. Pregnancy history. What immunizations do I need? Vaccines are usually given at various ages, according to a schedule. Your health care provider will recommend vaccines for you based on your age, medical history, and lifestyle or other factors, such as travel or where you work. What tests do I need? Blood tests Lipid and cholesterol levels. These may be checked every 5 years, or more often if you are over 38 years old. Hepatitis C test. Hepatitis B test. Screening Lung cancer screening. You may have this screening every year starting at age 14 if you have a 30-pack-year history of smoking and currently smoke or have quit within the past 15 years. Colorectal cancer screening. All adults should have this screening starting at age 26 and continuing until age 58. Your health care provider may recommend screening at age 21 if you are at increased risk. You will have tests every 1-10 years, depending on your results and the type of screening test. Diabetes screening. This is done by checking your blood sugar (glucose) after you have not eaten for a while (fasting). You  may have this done every 1-3 years. Mammogram. This may be done every 1-2 years. Talk with your health care provider about when you should start having regular mammograms. This may depend on whether you have a family history of breast cancer. BRCA-related cancer screening. This may be done if you have a family history of breast, ovarian, tubal, or peritoneal cancers. Pelvic exam and Pap test. This may be done every 3 years starting at age  63. Starting at age 70, this may be done every 5 years if you have a Pap test in combination with an HPV test. Other tests STD (sexually transmitted disease) testing, if you are at risk. Bone density scan. This is done to screen for osteoporosis. You may have this scan if you are at high risk for osteoporosis. Talk with your health care provider about your test results, treatment options, and if necessary, the need for more tests. Follow these instructions at home: Eating and drinking  Eat a diet that includes fresh fruits and vegetables, whole grains, lean protein, and low-fat dairy products. Take vitamin and mineral supplements as recommended by your health care provider. Do not drink alcohol if: Your health care provider tells you not to drink. You are pregnant, may be pregnant, or are planning to become pregnant. If you drink alcohol: Limit how much you have to 0-1 drink a day. Be aware of how much alcohol is in your drink. In the [place] one drink equals one 12 oz bottle of beer (355 mL), one 5 oz glass of wine (148 mL), or one 1 oz glass of hard liquor (44 mL). Lifestyle Take daily care of your teeth and gums. Brush your teeth every morning and night with fluoride toothpaste. Floss one time each day. Stay active. Exercise for at least 30 minutes 5 or more days each week. Do not use any products that contain nicotine or tobacco, such as cigarettes, e-cigarettes, and chewing tobacco. If you need help quitting, ask your health care provider. Do not use drugs. If you are sexually active, practice safe sex. Use a condom or other form of protection to prevent STIs (sexually transmitted infections). If you do not wish to become pregnant, use a form of birth control. If you plan to become pregnant, see your health care provider for a prepregnancy visit. If told by your health care provider, take low-dose aspirin daily starting at age 33. Find healthy ways to cope with stress, such  as: Meditation, yoga, or listening to music. Journaling. Talking to a trusted person. Spending time with friends and family. Safety Always wear your seat belt while driving or riding in a vehicle. Do not drive: If you have been drinking alcohol. Do not ride with someone who has been drinking. When you are tired or distracted. While texting. Wear a helmet and other protective equipment during sports activities. If you have firearms in your house, make sure you follow all gun safety procedures. What's next? Visit your health care provider once a year for an annual wellness visit. Ask your health care provider how often you should have your eyes and teeth checked. Stay up to date on all vaccines. This information is not intended to replace advice given to you by your health care provider. Make sure you discuss any questions you have with your health care provider. Document Revised: 08/16/2020 Document Reviewed: 02/17/2018 Elsevier Patient Education  2022 [employer].

## 2021-04-18 NOTE — Progress Notes (Signed)
Chief Complaint:  Theresa Rivera is a 60 y.o. female who presents today for her annual comprehensive physical exam.    Assessment/Plan:  Chronic Problems Addressed Today: Dyslipidemia Check labs.  Continue lifestyle modifications.   Cold sore We will refill acyclovir.  She uses as needed for outbreaks.  Body mass index is 25.87 kg/m. / Overweight  BMI Metric Follow Up - 04/18/21 0902       BMI Metric Follow Up-Please document annually   BMI Metric Follow Up Education provided             Preventative Healthcare: Check Labs. Pap next year. UTD on flu and covid vaccines. Will check with pharmacy for shingles vaccines.   Patient Counseling(The following topics were reviewed and/or handout was given):  -Nutrition: Stressed importance of moderation in sodium/caffeine intake, saturated fat and cholesterol, caloric balance, sufficient intake of fresh fruits, vegetables, and fiber.  -Stressed the importance of regular exercise.   -Substance Abuse: Discussed cessation/primary prevention of tobacco, alcohol, or other drug use; driving or other dangerous activities under the influence; availability of treatment for abuse.   -Injury prevention: Discussed safety belts, safety helmets, smoke detector, smoking near bedding or upholstery.   -Sexuality: Discussed sexually transmitted diseases, partner selection, use of condoms, avoidance of unintended pregnancy and contraceptive alternatives.   -Dental health: Discussed importance of regular tooth brushing, flossing, and dental visits.  -Health maintenance and immunizations reviewed. Please refer to Health maintenance section.  Return to care in 1 year for next preventative visit.     Subjective:  HPI:  She has no acute complaints today.    Cataracts were also diagnosed, after she went to a checkup to see about changes in her vision.  A few months ago her husband suffered a rotator cuff injury which has impacted her  physical activity.  There also have been a couple UTI's this year.   Lifestyle Diet: Reasonably healthy diet.  Exercise: Plays golf, does yardwork.   Depression screen PHQ 2/9 04/18/2021  Decreased Interest 0  Down, Depressed, Hopeless 0  PHQ - 2 Score 0    Health Maintenance Due  Topic Date Due   HIV Screening  Never done   Hepatitis C Screening  Never done   Zoster Vaccines- Shingrix (1 of 2) Never done   COVID-19 Vaccine (5 - Booster for Pfizer series) 11/14/2020   INFLUENZA VACCINE  01/20/2021     ROS: Per HPI, otherwise a complete review of systems was negative.   PMH:  The following were reviewed and entered/updated in epic: History reviewed. No pertinent past medical history. Patient Active Problem List   Diagnosis Date Noted   Cold sore 04/18/2021   Dyslipidemia 04/18/2021   Allergic rhinitis 04/12/2013   History reviewed. No pertinent surgical history.  Family History  Problem Relation Age of Onset   Prostate cancer Father    Breast cancer Maternal Grandmother 76    Medications- reviewed and updated Current Outpatient Medications  Medication Sig Dispense Refill   naproxen sodium (ALEVE) 220 MG tablet Take by mouth.     acyclovir (ZOVIRAX) 200 MG capsule Take 4 capsules (800 mg total) by mouth 2 (two) times daily. Take for 5 days as needed for flares 90 capsule 1   No current facility-administered medications for this visit.    Allergies-reviewed and updated No Known Allergies  Social History   Socioeconomic History   Marital status: Married    Spouse name: Not on file   Number of children:  Not on file   Years of education: Not on file   Highest education level: Not on file  Occupational History   Not on file  Tobacco Use   Smoking status: Never   Smokeless tobacco: Never  Substance and Sexual Activity   Alcohol use: Yes    Alcohol/week: 7.0 standard drinks    Types: 7 Glasses of wine per week   Drug use: Never   Sexual activity: Yes   Other Topics Concern   Not on file  Social History Narrative   Not on file   Social Determinants of Health   Financial Resource Strain: Not on file  Food Insecurity: Not on file  Transportation Needs: Not on file  Physical Activity: Not on file  Stress: Not on file  Social Connections: Not on file        Objective:  Physical Exam: BP 127/80   Pulse 75   Temp 98.3 F (36.8 C) (Temporal)   Ht 5\' 7"  (1.702 m)   Wt 165 lb 3.2 oz (74.9 kg)   SpO2 99%   BMI 25.87 kg/m   Body mass index is 25.87 kg/m. Wt Readings from Last 3 Encounters:  04/18/21 165 lb 3.2 oz (74.9 kg)  03/26/20 163 lb (73.9 kg)   Gen: NAD, resting comfortably HEENT: TMs normal bilaterally. OP clear. No thyromegaly noted.  CV: RRR with no murmurs appreciated Pulm: NWOB, CTAB with no crackles, wheezes, or rhonchi GI: Normal bowel sounds present. Soft, Nontender, Nondistended. MSK: no edema, cyanosis, or clubbing noted Skin: warm, dry Neuro: CN2-12 grossly intact. Strength 5/5 in upper and lower extremities. Reflexes symmetric and intact bilaterally.  Psych: Normal affect and thought content     I,Jordan Kelly,acting as a scribe for 05/26/20, MD.,have documented all relevant documentation on the behalf of [name], MD,as directed by  [name], MD while in the presence of [name], MD.  I, [name], MD, have reviewed all documentation for this visit. The documentation on 04/18/21 for the exam, diagnosis, procedures, and orders are all accurate and complete.  04/20/21. Katina Degree, MD 04/18/2021 9:02 AM

## 2021-04-21 NOTE — Progress Notes (Signed)
Please inform patient of the following:  Cholesterol is borderline but everything else is STABLE. Do not need to make any changes to her treatment plan at this time. We can recheck in a year.

## 2021-04-22 NOTE — Telephone Encounter (Signed)
Lab done on appt day

## 2021-04-24 ENCOUNTER — Encounter: Payer: Self-pay | Admitting: Family Medicine

## 2021-05-22 ENCOUNTER — Ambulatory Visit
Admission: RE | Admit: 2021-05-22 | Discharge: 2021-05-22 | Disposition: A | Discharge status: Home / Self Care | Payer: 59 | Source: Ambulatory Visit | Attending: Family Medicine | Admitting: Family Medicine

## 2021-05-22 DIAGNOSIS — Z1231 Encounter for screening mammogram for malignant neoplasm of breast: Secondary | ICD-10-CM

## 2021-11-14 ENCOUNTER — Encounter (HOSPITAL_BASED_OUTPATIENT_CLINIC_OR_DEPARTMENT_OTHER): Payer: Self-pay

## 2021-11-14 ENCOUNTER — Other Ambulatory Visit (HOSPITAL_BASED_OUTPATIENT_CLINIC_OR_DEPARTMENT_OTHER): Payer: Self-pay

## 2021-11-14 MED ORDER — GATIFLOXACIN 0.5 % OP SOLN
OPHTHALMIC | 1 refills | Status: DC
  Filled 2021-11-14: qty 2.5, 16d supply, fill #0
  Filled 2021-11-19: qty 5, 24d supply, fill #0
  Filled 2021-12-19: qty 5, 24d supply, fill #1

## 2021-11-14 MED ORDER — KETOROLAC TROMETHAMINE 0.5 % OP SOLN
OPHTHALMIC | 1 refills | Status: DC
  Filled 2021-11-14 – 2021-11-19 (×2): qty 5, 25d supply, fill #0

## 2021-11-14 MED ORDER — DIFLUPREDNATE 0.05 % OP EMUL
OPHTHALMIC | 1 refills | Status: DC
  Filled 2021-11-14: qty 5, 25d supply, fill #0

## 2021-11-18 ENCOUNTER — Other Ambulatory Visit (HOSPITAL_BASED_OUTPATIENT_CLINIC_OR_DEPARTMENT_OTHER): Payer: Self-pay

## 2021-11-19 ENCOUNTER — Other Ambulatory Visit (HOSPITAL_BASED_OUTPATIENT_CLINIC_OR_DEPARTMENT_OTHER): Payer: Self-pay

## 2021-12-05 ENCOUNTER — Ambulatory Visit: Payer: 59 | Attending: Internal Medicine

## 2021-12-05 ENCOUNTER — Other Ambulatory Visit (HOSPITAL_BASED_OUTPATIENT_CLINIC_OR_DEPARTMENT_OTHER): Payer: Self-pay

## 2021-12-05 DIAGNOSIS — Z23 Encounter for immunization: Secondary | ICD-10-CM

## 2021-12-05 MED ORDER — PFIZER COVID-19 VAC BIVALENT 30 MCG/0.3ML IM SUSP
INTRAMUSCULAR | 0 refills | Status: DC
  Filled 2021-12-05: qty 0.3, 1d supply, fill #0

## 2021-12-05 NOTE — Progress Notes (Signed)
   Covid-19 Vaccination Clinic  Name:  Theresa Rivera    MRN: [id number] DOB: July 14, 1960  12/05/2021  [name] was observed post Covid-19 immunization for 15 minutes without incident. She was provided with Vaccine Information Sheet and instruction to access the V-Safe system.   [name] was instructed to call 911 with any severe reactions post vaccine: Difficulty breathing  Swelling of face and throat  A fast heartbeat  A bad rash all over body  Dizziness and weakness   Immunizations Administered     Name Date Dose VIS Date Route   Pfizer Covid-19 Vaccine Bivalent Booster 12/05/2021 11:30 AM 0.3 mL 02/19/2021 Intramuscular   Manufacturer: [employer], [employer]   Lot: [id number]   NDC: [phone]

## 2021-12-09 ENCOUNTER — Encounter: Payer: Self-pay | Admitting: Family Medicine

## 2021-12-19 ENCOUNTER — Other Ambulatory Visit (HOSPITAL_BASED_OUTPATIENT_CLINIC_OR_DEPARTMENT_OTHER): Payer: Self-pay

## 2022-02-22 ENCOUNTER — Encounter: Payer: Self-pay | Admitting: Family Medicine

## 2022-02-24 NOTE — Telephone Encounter (Signed)
Spoke with patient, stated symptoms stared on Saturday tested positive on Sunday 02/22/2022. Today feeling fatigue and elevated temperature  Advise to schedule a virtual appointment, patient stated will see how she is feeling by tomorrow and if symptoms worsen will call for appointment Or use virtual visit on mychart

## 2022-03-15 ENCOUNTER — Encounter: Payer: Self-pay | Admitting: Family Medicine

## 2022-03-16 ENCOUNTER — Encounter: Payer: Self-pay | Admitting: *Deleted

## 2022-03-16 NOTE — Telephone Encounter (Signed)
Please advise 

## 2022-03-17 NOTE — Telephone Encounter (Signed)
Recommend to wait at least 4 months before getting the booster. It is ok for her to get all the vaccines at the same time.  Algis Greenhouse. Jerline Pain, MD 03/17/2022 7:29 AM

## 2022-03-24 ENCOUNTER — Other Ambulatory Visit (HOSPITAL_BASED_OUTPATIENT_CLINIC_OR_DEPARTMENT_OTHER): Payer: Self-pay

## 2022-03-24 ENCOUNTER — Telehealth: Payer: 59 | Admitting: Physician Assistant

## 2022-03-24 DIAGNOSIS — L239 Allergic contact dermatitis, unspecified cause: Secondary | ICD-10-CM

## 2022-03-24 MED ORDER — PREDNISONE 10 MG PO TABS
ORAL_TABLET | ORAL | 0 refills | Status: AC
  Filled 2022-03-24: qty 37, 14d supply, fill #0

## 2022-03-24 NOTE — Progress Notes (Signed)
I have spent 5 minutes in review of e-visit questionnaire, review and updating patient chart, medical decision making and response to patient.   Duy Lemming Cody Layci Stenglein, PA-C    

## 2022-03-24 NOTE — Progress Notes (Signed)
Message sent to patient requesting further input regarding current symptoms. Awaiting patient response.  

## 2022-03-24 NOTE — Progress Notes (Signed)
E Visit for Rash  We are sorry that you are not feeling well. Here is how we plan to help!  Based on what you shared with me it looks like you have contact dermatitis.  Contact dermatitis is a skin rash caused by something that touches the skin and causes irritation or inflammation.  Your skin may be red, swollen, dry, cracked, and itch.  The rash should go away in a few days but can last a few weeks.  If you get a rash, it's important to figure out what caused it so the irritant can be avoided in the future. and I am prescribing a two week course of steroids (37 tablets of 10 mg prednisone).  Days 1-4 take 4 tablets (40 mg) daily  Days 5-8 take 3 tablets (30 mg) daily, Days 9-11 take 2 tablets (20 mg) daily, Days 12-14 take 1 tablet (10 mg) daily.       HOME CARE:  Take cool showers and avoid direct sunlight. Apply cool compress or wet dressings. Take a bath in an oatmeal bath.  Sprinkle content of one Aveeno packet under running faucet with comfortably warm water.  Bathe for 15-20 minutes, 1-2 times daily.  Pat dry with a towel. Do not rub the rash. Use hydrocortisone cream. Take an antihistamine like Benadryl for widespread rashes that itch.  The adult dose of Benadryl is 25-50 mg by mouth 4 times daily. Caution:  This type of medication may cause sleepiness.  Do not drink alcohol, drive, or operate dangerous machinery while taking antihistamines.  Do not take these medications if you have prostate enlargement.  Read package instructions thoroughly on all medications that you take.  GET HELP RIGHT AWAY IF:  Symptoms don't go away after treatment. Severe itching that persists. If you rash spreads or swells. If you rash begins to smell. If it blisters and opens or develops a yellow-brown crust. You develop a fever. You have a sore throat. You become short of breath.  MAKE SURE YOU:  Understand these instructions. Will watch your condition. Will get help right away if you are not  doing well or get worse.  Thank you for choosing an e-visit.  Your e-visit answers were reviewed by a board certified advanced clinical practitioner to complete your personal care plan. Depending upon the condition, your plan could have included both over the counter or prescription medications.  Please review your pharmacy choice. Make sure the pharmacy is open so you can pick up prescription now. If there is a problem, you may contact your provider through MyChart messaging and have the prescription routed to another pharmacy.  Your safety is important to us. If you have drug allergies check your prescription carefully.   For the next 24 hours you can use MyChart to ask questions about today's visit, request a non-urgent call back, or ask for a work or school excuse. You will get an email in the next two days asking about your experience. I hope that your e-visit has been valuable and will speed your recovery.  

## 2022-04-08 ENCOUNTER — Other Ambulatory Visit (HOSPITAL_BASED_OUTPATIENT_CLINIC_OR_DEPARTMENT_OTHER): Payer: Self-pay

## 2022-04-08 ENCOUNTER — Encounter: Payer: Self-pay | Admitting: Family Medicine

## 2022-04-08 ENCOUNTER — Ambulatory Visit (INDEPENDENT_AMBULATORY_CARE_PROVIDER_SITE_OTHER): Payer: 59 | Admitting: Family Medicine

## 2022-04-08 VITALS — BP 123/85 | HR 69 | Temp 97.3°F | Ht 67.0 in | Wt 157.2 lb

## 2022-04-08 DIAGNOSIS — R739 Hyperglycemia, unspecified: Secondary | ICD-10-CM | POA: Diagnosis not present

## 2022-04-08 DIAGNOSIS — Z0001 Encounter for general adult medical examination with abnormal findings: Secondary | ICD-10-CM

## 2022-04-08 DIAGNOSIS — E785 Hyperlipidemia, unspecified: Secondary | ICD-10-CM | POA: Diagnosis not present

## 2022-04-08 DIAGNOSIS — R21 Rash and other nonspecific skin eruption: Secondary | ICD-10-CM | POA: Diagnosis not present

## 2022-04-08 DIAGNOSIS — J309 Allergic rhinitis, unspecified: Secondary | ICD-10-CM | POA: Diagnosis not present

## 2022-04-08 MED ORDER — TRIAMCINOLONE ACETONIDE 0.5 % EX OINT
1.0000 | TOPICAL_OINTMENT | Freq: Two times a day (BID) | CUTANEOUS | 0 refills | Status: DC
  Filled 2022-04-08: qty 15, 30d supply, fill #0

## 2022-04-08 MED ORDER — HYDROXYZINE HCL 50 MG PO TABS
50.0000 mg | ORAL_TABLET | Freq: Three times a day (TID) | ORAL | 0 refills | Status: DC | PRN
  Filled 2022-04-08: qty 90, 30d supply, fill #0

## 2022-04-08 NOTE — Assessment & Plan Note (Signed)
Overall stable.  She will be doubling up on anti histamine as above.

## 2022-04-08 NOTE — Assessment & Plan Note (Signed)
She will be coming back soon for CPE.  We will check lipids prior to her CPE.

## 2022-04-08 NOTE — Addendum Note (Signed)
Addended by: Betti Cruz on: 04/08/2022 08:40 AM   Modules accepted: Orders

## 2022-04-08 NOTE — Progress Notes (Signed)
   Theresa Rivera is a 61 y.o. female who presents today for an office visit.  Assessment/Plan:  New/Acute Problems: Rash Consistent with urticaria.  Possibly precipitated by vaccines.  No other new obvious exposures.  Recommend she double up on her second-generation antihistamine.  She can try using a combination or taking a double dose.  We will also start hydroxyzine 50 mg nightly as well as topical triamcinolone. She will let me know if not improving in the next 1 to 2 weeks.  We will consider addition of singulair versus referral to dermatology.   Chronic Problems Addressed Today: Dyslipidemia She will be coming back soon for CPE.  We will check lipids prior to her CPE.  Allergic rhinitis Overall stable.  She will be doubling up on anti histamine as above.     Subjective:  HPI:  Patient here with concern for rash.  Symptoms started about 3 weeks ago. Rash is predominantly located on right mid back.  Very itchy.  She does note that she had 3 separate vaccines 2 days before the rash outbreak with flu, Tdap, and RSV.  She tried topical cortisone cream and Benadryl with modest improvement.  Had a virtual visit a couple weeks ago and was started on prednisone taper which did not give much relief.  She took over-the-counter dose of Xyzal without any improvement.  No obvious new exposures or detergents.  Nobody at home has had similar issues.  Rash has begun to progress and involve her neck and bilateral axilla as well.        Objective:  Physical Exam: BP 123/85   Pulse 69   Temp (!) 97.3 F (36.3 C) (Temporal)   Ht 5\' 7"  (1.702 m)   Wt 157 lb 3.2 oz (71.3 kg)   SpO2 99%   BMI 24.62 kg/m   Gen: No acute distress, resting comfortably Skin: Urticarial appearing rash on right mid back with erythema and papules. Neuro: Grossly normal, moves all extremities Psych: Normal affect and thought content      Theresa Hawkey M. Jerline Pain, MD 04/08/2022 7:52 AM

## 2022-04-14 ENCOUNTER — Encounter: Payer: Self-pay | Admitting: Family Medicine

## 2022-04-14 NOTE — Telephone Encounter (Signed)
Please make sure she has taken a double dose of the antihistamines as we discussed and also the nightly hydroxyzine.  IF she is still doing all of this then we can try adding on singulair 10mg  daily as a new prescription.   She should follow up with [place] in 1-2 weeks.  Algis Greenhouse. Jerline Pain, MD 04/14/2022 11:52 AM

## 2022-04-14 NOTE — Telephone Encounter (Signed)
Please advise 

## 2022-04-16 ENCOUNTER — Other Ambulatory Visit (HOSPITAL_BASED_OUTPATIENT_CLINIC_OR_DEPARTMENT_OTHER): Payer: Self-pay

## 2022-04-16 ENCOUNTER — Other Ambulatory Visit: Payer: 59

## 2022-04-16 ENCOUNTER — Ambulatory Visit (INDEPENDENT_AMBULATORY_CARE_PROVIDER_SITE_OTHER): Payer: 59 | Admitting: Family Medicine

## 2022-04-16 ENCOUNTER — Encounter: Payer: Self-pay | Admitting: Family Medicine

## 2022-04-16 VITALS — BP 135/81 | HR 70 | Temp 97.7°F | Ht 67.0 in | Wt 161.8 lb

## 2022-04-16 DIAGNOSIS — E785 Hyperlipidemia, unspecified: Secondary | ICD-10-CM

## 2022-04-16 DIAGNOSIS — R739 Hyperglycemia, unspecified: Secondary | ICD-10-CM | POA: Diagnosis not present

## 2022-04-16 DIAGNOSIS — Z0001 Encounter for general adult medical examination with abnormal findings: Secondary | ICD-10-CM

## 2022-04-16 DIAGNOSIS — B001 Herpesviral vesicular dermatitis: Secondary | ICD-10-CM

## 2022-04-16 DIAGNOSIS — R21 Rash and other nonspecific skin eruption: Secondary | ICD-10-CM | POA: Diagnosis not present

## 2022-04-16 DIAGNOSIS — J309 Allergic rhinitis, unspecified: Secondary | ICD-10-CM | POA: Diagnosis not present

## 2022-04-16 LAB — COMPREHENSIVE METABOLIC PANEL
ALT: 27 U/L (ref 0–35)
AST: 24 U/L (ref 0–37)
Albumin: 4.3 g/dL (ref 3.5–5.2)
Alkaline Phosphatase: 54 U/L (ref 39–117)
BUN: 15 mg/dL (ref 6–23)
CO2: 32 mEq/L (ref 19–32)
Calcium: 10.1 mg/dL (ref 8.4–10.5)
Chloride: 105 mEq/L (ref 96–112)
Creatinine, Ser: 0.92 mg/dL (ref 0.40–1.20)
GFR: 66.43 mL/min (ref >60.00)
Glucose, Bld: 103 mg/dL — ABNORMAL HIGH (ref 70–99)
Potassium: 5.6 mEq/L — ABNORMAL HIGH (ref 3.5–5.1)
Sodium: 144 mEq/L (ref 135–145)
Total Bilirubin: 0.6 mg/dL (ref 0.2–1.2)
Total Protein: 7.2 g/dL (ref 6.0–8.3)

## 2022-04-16 LAB — CBC
HCT: 43.3 % (ref 36.0–46.0)
Hemoglobin: 14.4 g/dL (ref 12.0–15.0)
MCHC: 33.1 g/dL (ref 30.0–36.0)
MCV: 93.3 fl (ref 78.0–100.0)
Platelets: 269 10*3/uL (ref 150.0–400.0)
RBC: 4.64 Mil/uL (ref 3.87–5.11)
RDW: 12.2 % (ref 11.5–15.5)
WBC: 6.5 10*3/uL (ref 4.0–10.5)

## 2022-04-16 LAB — LIPID PANEL
Cholesterol: 253 mg/dL — ABNORMAL HIGH (ref 0–200)
HDL: 63.9 mg/dL (ref >39.00)
LDL Cholesterol: 159 mg/dL — ABNORMAL HIGH (ref 0–99)
NonHDL: 188.98
Total CHOL/HDL Ratio: 4
Triglycerides: 149 mg/dL (ref 0.0–149.0)
VLDL: 29.8 mg/dL (ref 0.0–40.0)

## 2022-04-16 LAB — HEMOGLOBIN A1C: Hgb A1c MFr Bld: 5.8 % (ref 4.6–6.5)

## 2022-04-16 LAB — TSH: TSH: 1.11 u[IU]/mL (ref 0.35–5.50)

## 2022-04-16 MED ORDER — MONTELUKAST SODIUM 10 MG PO TABS
10.0000 mg | ORAL_TABLET | Freq: Every day | ORAL | 3 refills | Status: DC
  Filled 2022-04-16: qty 90, 90d supply, fill #0
  Filled 2022-08-27: qty 30, 30d supply, fill #1
  Filled 2022-09-21: qty 30, 30d supply, fill #2
  Filled 2022-10-20: qty 30, 30d supply, fill #3

## 2022-04-16 MED ORDER — PREDNISONE 20 MG PO TABS
20.0000 mg | ORAL_TABLET | Freq: Every day | ORAL | 0 refills | Status: DC
  Filled 2022-04-16: qty 21, 21d supply, fill #0

## 2022-04-16 MED ORDER — METHYLPREDNISOLONE ACETATE 80 MG/ML IJ SUSP
80.0000 mg | Freq: Once | INTRAMUSCULAR | Status: AC
  Administered 2022-04-16: 80 mg via INTRAMUSCULAR

## 2022-04-16 NOTE — Patient Instructions (Addendum)
It was very nice to see you today!  We biopsied your rash today.  Please keep the area clean and dry for the next day or so.  Let us know if you have any signs of infection.  We gave you a steroid shot today and will start through a course of prednisone.  Please also start the Singulair.  It is okay for you to continue with the antihistamines and hydroxyzine.  I will refer you to see the allergist.   Take care, Dr Jerline Pain  PLEASE NOTE:  If you had any lab tests please let us know if you have not heard back within a few days. You may see your results on mychart before we have a chance to review them but we will give you a call once they are reviewed by [place]. If we ordered any referrals today, please let us know if you have not heard from their office within the next week.   Please try these tips to maintain a healthy lifestyle:  Eat at least 3 REAL meals and 1-2 snacks per day.  Aim for no more than 5 hours between eating.  If you eat breakfast, please do so within one hour of getting up.   Each meal should contain half fruits/vegetables, one quarter protein, and one quarter carbs (no bigger than a computer mouse)  Cut down on sweet beverages. This includes juice, soda, and sweet tea.   Drink at least 1 glass of water with each meal and aim for at least 8 glasses per day  Exercise at least 150 minutes every week.

## 2022-04-16 NOTE — Assessment & Plan Note (Addendum)
Check lipids today.  She will get come back soon for CPE.

## 2022-04-16 NOTE — Progress Notes (Signed)
   Theresa Rivera is a 61 y.o. female who presents today for an office visit.  Assessment/Plan:  New/Acute Problems: Rash Consistent with urticaria.  Not responding to over-the-counter antihistamines.  We will give 80 mg Depo-Medrol today and start 3-week course of prednisone.  Also add on Singulair to her regimen.  Punctuality was performed today.  See below procedure note.  She is getting blood work done today and we will add on a CU index.  She will follow-up with me in a couple weeks for CPE.  If still no improvement she will need to follow-up with dermatology.  Chronic Problems Addressed Today: Dyslipidemia Check lipids today.  She will get come back soon for CPE.  Cold sore Stable on acyclovir.  Allergic rhinitis Overall stable.  Continue over-the-counter antihistamine.  We will be placing referral to allergy for allergy testing.     Subjective:  HPI:  Patient here for follow-up.  She was seen here a week ago with rash.  We had seen her after she had had an E-visit had a course of steroids.  Rash was very pruritic.  We were concerned about urticaria and recommended that she start doubling her dose of over-the-counter antihistamines.  We also started hydroxyzine and topical triamcinolone.  Symptoms have not improved and have continued to spread over the last week or so.  Hydroxyzine helps for a few hours and then wears off.  Rash is very pruritic.  No reported fevers or chills.       Objective:  Physical Exam: BP 135/81   Pulse 70   Temp 97.7 F (36.5 C) (Temporal)   Ht 5\' 7"  (1.702 m)   Wt 161 lb 12.8 oz (73.4 kg)   SpO2 98%   BMI 25.34 kg/m   Gen: No acute distress, resting comfortably CV: Regular rate and rhythm with no murmurs appreciated Pulm: Normal work of breathing, clear to auscultation bilaterally with no crackles, wheezes, or rhonchi Skin: Urticarial appearing rash on mid back with erythema and papules. Neuro: Grossly normal, moves all  extremities Psych: Normal affect and thought content   Procedure Note After informed written consent was obtained, using Betadine for cleansing and 1% Lidocaine with epinephrine for anesthetic, with sterile technique a 3 mm punch biopsy was used to obtain a biopsy specimen of the lesion. Hemostasis was obtained by pressure and wound was  not sutured. Antibiotic dressing is applied, and wound care instructions provided. Be alert for any signs of cutaneous infection. The specimen is labeled and sent to pathology for evaluation. The procedure was well tolerated without complications.       Algis Greenhouse. Jerline Pain, MD 04/16/2022 10:10 AM

## 2022-04-16 NOTE — Assessment & Plan Note (Signed)
Overall stable.  Continue over-the-counter antihistamine.  We will be placing referral to allergy for allergy testing.

## 2022-04-16 NOTE — Assessment & Plan Note (Signed)
Stable on acyclovir

## 2022-04-16 NOTE — Addendum Note (Signed)
Addended by: Betti Cruz on: 04/16/2022 10:29 AM   Modules accepted: Orders

## 2022-04-20 ENCOUNTER — Encounter: Payer: Self-pay | Admitting: Family Medicine

## 2022-04-20 ENCOUNTER — Other Ambulatory Visit: Payer: Self-pay | Admitting: Family Medicine

## 2022-04-20 ENCOUNTER — Other Ambulatory Visit (HOSPITAL_COMMUNITY)
Admission: RE | Admit: 2022-04-20 | Discharge: 2022-04-20 | Disposition: A | Discharge status: Home / Self Care | Payer: 59 | Source: Ambulatory Visit | Attending: Family Medicine | Admitting: Family Medicine

## 2022-04-20 ENCOUNTER — Ambulatory Visit (INDEPENDENT_AMBULATORY_CARE_PROVIDER_SITE_OTHER): Payer: 59 | Admitting: Family Medicine

## 2022-04-20 VITALS — BP 120/70 | HR 72 | Temp 97.7°F | Ht 67.0 in | Wt 161.2 lb

## 2022-04-20 DIAGNOSIS — R739 Hyperglycemia, unspecified: Secondary | ICD-10-CM | POA: Diagnosis not present

## 2022-04-20 DIAGNOSIS — E785 Hyperlipidemia, unspecified: Secondary | ICD-10-CM | POA: Diagnosis not present

## 2022-04-20 DIAGNOSIS — Z1231 Encounter for screening mammogram for malignant neoplasm of breast: Secondary | ICD-10-CM

## 2022-04-20 DIAGNOSIS — Z124 Encounter for screening for malignant neoplasm of cervix: Secondary | ICD-10-CM | POA: Diagnosis not present

## 2022-04-20 DIAGNOSIS — Z0001 Encounter for general adult medical examination with abnormal findings: Secondary | ICD-10-CM | POA: Diagnosis not present

## 2022-04-20 NOTE — Patient Instructions (Signed)
It was very nice to see you today!  We are still waiting on your test results from last week to come back.  I am glad that your rash is improving.  He can continue current regimen.  It would be a good idea for you to schedule appointment soon with your dermatologist or allergist to have allergy testing performed.  We will do your Pap test today.  Please continue to work on diet and exercise.  I will see you back in year for your next physical.  Come back sooner if needed.  Take care, Dr Jerline Pain  PLEASE NOTE:  If you had any lab tests please let us know if you have not heard back within a few days. You may see your results on mychart before we have a chance to review them but we will give you a call once they are reviewed by [place]. If we ordered any referrals today, please let us know if you have not heard from their office within the next week.   Please try these tips to maintain a healthy lifestyle:  Eat at least 3 REAL meals and 1-2 snacks per day.  Aim for no more than 5 hours between eating.  If you eat breakfast, please do so within one hour of getting up.   Each meal should contain half fruits/vegetables, one quarter protein, and one quarter carbs (no bigger than a computer mouse)  Cut down on sweet beverages. This includes juice, soda, and sweet tea.   Drink at least 1 glass of water with each meal and aim for at least 8 glasses per day  Exercise at least 150 minutes every week.    Preventive Care 61-32 Years Old, Female Preventive care refers to lifestyle choices and visits with your health care provider that can promote health and wellness. Preventive care visits are also called wellness exams. What can I expect for my preventive care visit? Counseling Your health care provider may ask you questions about your: Medical history, including: Past medical problems. Family medical history. Pregnancy history. Current health, including: Menstrual cycle. Method of birth  control. Emotional well-being. Home life and relationship well-being. Sexual activity and sexual health. Lifestyle, including: Alcohol, nicotine or tobacco, and drug use. Access to firearms. Diet, exercise, and sleep habits. Work and work Statistician. Sunscreen use. Safety issues such as seatbelt and bike helmet use. Physical exam Your health care provider will check your: Height and weight. These may be used to calculate your BMI (body mass index). BMI is a measurement that tells if you are at a healthy weight. Waist circumference. This measures the distance around your waistline. This measurement also tells if you are at a healthy weight and may help predict your risk of certain diseases, such as type 2 diabetes and high blood pressure. Heart rate and blood pressure. Body temperature. Skin for abnormal spots. What immunizations do I need?  Vaccines are usually given at various ages, according to a schedule. Your health care provider will recommend vaccines for you based on your age, medical history, and lifestyle or other factors, such as travel or where you work. What tests do I need? Screening Your health care provider may recommend screening tests for certain conditions. This may include: Lipid and cholesterol levels. Diabetes screening. This is done by checking your blood sugar (glucose) after you have not eaten for a while (fasting). Pelvic exam and Pap test. Hepatitis B test. Hepatitis C test. HIV (human immunodeficiency virus) test. STI (sexually transmitted infection) testing,  if you are at risk. Lung cancer screening. Colorectal cancer screening. Mammogram. Talk with your health care provider about when you should start having regular mammograms. This may depend on whether you have a family history of breast cancer. BRCA-related cancer screening. This may be done if you have a family history of breast, ovarian, tubal, or peritoneal cancers. Bone density scan. This is  done to screen for osteoporosis. Talk with your health care provider about your test results, treatment options, and if necessary, the need for more tests. Follow these instructions at home: Eating and drinking  Eat a diet that includes fresh fruits and vegetables, whole grains, lean protein, and low-fat dairy products. Take vitamin and mineral supplements as recommended by your health care provider. Do not drink alcohol if: Your health care provider tells you not to drink. You are pregnant, may be pregnant, or are planning to become pregnant. If you drink alcohol: Limit how much you have to 0-1 drink a day. Know how much alcohol is in your drink. In the [place] one drink equals one 12 oz bottle of beer (355 mL), one 5 oz glass of wine (148 mL), or one 1 oz glass of hard liquor (44 mL). Lifestyle Brush your teeth every morning and night with fluoride toothpaste. Floss one time each day. Exercise for at least 30 minutes 5 or more days each week. Do not use any products that contain nicotine or tobacco. These products include cigarettes, chewing tobacco, and vaping devices, such as e-cigarettes. If you need help quitting, ask your health care provider. Do not use drugs. If you are sexually active, practice safe sex. Use a condom or other form of protection to prevent STIs. If you do not wish to become pregnant, use a form of birth control. If you plan to become pregnant, see your health care provider for a prepregnancy visit. Take aspirin only as told by your health care provider. Make sure that you understand how much to take and what form to take. Work with your health care provider to find out whether it is safe and beneficial for you to take aspirin daily. Find healthy ways to manage stress, such as: Meditation, yoga, or listening to music. Journaling. Talking to a trusted person. Spending time with friends and family. Minimize exposure to UV radiation to reduce your risk of skin  cancer. Safety Always wear your seat belt while driving or riding in a vehicle. Do not drive: If you have been drinking alcohol. Do not ride with someone who has been drinking. When you are tired or distracted. While texting. If you have been using any mind-altering substances or drugs. Wear a helmet and other protective equipment during sports activities. If you have firearms in your house, make sure you follow all gun safety procedures. Seek help if you have been physically or sexually abused. What's next? Visit your health care provider once a year for an annual wellness visit. Ask your health care provider how often you should have your eyes and teeth checked. Stay up to date on all vaccines. This information is not intended to replace advice given to you by your health care provider. Make sure you discuss any questions you have with your health care provider. Document Revised: 12/04/2020 Document Reviewed: 12/04/2020 Elsevier Patient Education  [place].

## 2022-04-20 NOTE — Assessment & Plan Note (Signed)
A1c 5.8 on last blood draw.  We discussed lifestyle modifications.  Recheck in a year.

## 2022-04-20 NOTE — Progress Notes (Signed)
Chief Complaint:  Theresa Rivera is a 61 y.o. female who presents today for her annual comprehensive physical exam.    Assessment/Plan:  New/Acute Problems: Rash Improving with prednisone and Singulair.  Likely urticarial rash.  Her CU index and punch biopsy from last week are still pending.  She will continue her course of prednisone and follow-up with dermatology if symptoms are still persisting.  Chronic Problems Addressed Today: Dyslipidemia Elevated LDL on labs from last week.  We discussed lifestyle modifications.  Hyperglycemia A1c 5.8 on last blood draw.  We discussed lifestyle modifications.  Recheck in a year.  Preventative Healthcare: Pap test performed today.  Will be getting mammogram later this year.  Up-to-date on other screening and vaccines.  Patient Counseling(The following topics were reviewed and/or handout was given):  -Nutrition: Stressed importance of moderation in sodium/caffeine intake, saturated fat and cholesterol, caloric balance, sufficient intake of fresh fruits, vegetables, and fiber.  -Stressed the importance of regular exercise.   -Substance Abuse: Discussed cessation/primary prevention of tobacco, alcohol, or other drug use; driving or other dangerous activities under the influence; availability of treatment for abuse.   -Injury prevention: Discussed safety belts, safety helmets, smoke detector, smoking near bedding or upholstery.   -Sexuality: Discussed sexually transmitted diseases, partner selection, use of condoms, avoidance of unintended pregnancy and contraceptive alternatives.   -Dental health: Discussed importance of regular tooth brushing, flossing, and dental visits.  -Health maintenance and immunizations reviewed. Please refer to Health maintenance section.  Return to care in 1 year for next preventative visit.     Subjective:  HPI:  She has no acute complaints today.   We have seen her couple times recently for urticarial  rash.  Last was 4 days ago.  Started on prednisone and Singulair.  Symptoms have improved significantly.  We did check a CU index and a punch biopsy which are still pending.  Lifestyle Diet: None specific.  Exercise: None specific.      04/20/2022    8:41 AM  Depression screen PHQ 2/9  Decreased Interest 0  Down, Depressed, Hopeless 0  PHQ - 2 Score 0    Health Maintenance Due  Topic Date Due   PAP SMEAR-Modifier  02/18/2022     ROS: Per HPI, otherwise a complete review of systems was negative.   PMH:  The following were reviewed and entered/updated in epic: History reviewed. No pertinent past medical history. 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 No pertinent surgical history.  Family History  Problem Relation Age of Onset   Prostate cancer Father    Breast cancer Maternal Grandmother 30    Medications- reviewed and updated Current Outpatient Medications  Medication Sig Dispense Refill   acyclovir (ZOVIRAX) 200 MG capsule Take 4 capsules (800 mg total) by mouth 2 (two) times daily. Take for 5 days as needed for flares 90 capsule 1   hydrOXYzine (ATARAX) 50 MG tablet Take 1 tablet (50 mg total) by mouth 3 (three) times daily as needed for itching. 90 tablet 0   montelukast (SINGULAIR) 10 MG tablet Take 1 tablet (10 mg total) by mouth at bedtime. 90 tablet 3   predniSONE (DELTASONE) 20 MG tablet Take 1 tablet (20 mg total) by mouth daily with breakfast. 21 tablet 0   triamcinolone ointment (KENALOG) 0.5 % Apply 1 Application topically 2 (two) times daily. [place]  g 0   No current facility-administered medications for this visit.    Allergies-reviewed and updated No Known Allergies  Social History   Socioeconomic History   Marital status: Married    Spouse name: Not on file   Number of children: Not on file   Years of  education: Not on file   Highest education level: Not on file  Occupational History   Not on file  Tobacco Use   Smoking status: Never   Smokeless tobacco: Never  Substance and Sexual Activity   Alcohol use: Yes    Alcohol/week: 7.0 standard drinks of alcohol    Types: 7 Glasses of wine per week   Drug use: Never   Sexual activity: Yes  Other Topics Concern   Not on file  Social History Narrative   Not on file   Social Determinants of Health   Financial Resource Strain: Not on file  Food Insecurity: Not on file  Transportation Needs: Not on file  Physical Activity: Not on file  Stress: Not on file  Social Connections: Not on file        Objective:  Physical Exam: BP 120/70   Pulse 72   Temp 97.7 F (36.5 C) (Temporal)   Ht 5\' 7"  (1.702 m)   Wt 161 lb 3.2 oz (73.1 kg)   SpO2 96%   BMI 25.25 kg/m   Body mass index is 25.25 kg/m. Wt Readings from Last 3 Encounters:  04/20/22 161 lb 3.2 oz (73.1 kg)  04/16/22 161 lb 12.8 oz (73.4 kg)  04/08/22 157 lb 3.2 oz (71.3 kg)   Gen: NAD, resting comfortably HEENT: TMs normal bilaterally. OP clear. No thyromegaly noted.  CV: RRR with no murmurs appreciated Pulm: NWOB, CTAB with no crackles, wheezes, or rhonchi GI: Normal bowel sounds present. Soft, Nontender, Nondistended. GU: Normal female genitalia. Chaperone present for exam.  MSK: no edema, cyanosis, or clubbing noted Skin: warm, dry.  Urticarial appearing rash on lower mid back.  Punch biopsy site with healthy-appearing granulation tissue no signs of infection. Neuro: CN2-12 grossly intact. Strength 5/5 in upper and lower extremities. Reflexes symmetric and intact bilaterally.  Psych: Normal affect and thought content     Theresa Rivera M. 04/10/22, MD 04/20/2022 9:04 AM

## 2022-04-20 NOTE — Assessment & Plan Note (Signed)
Elevated LDL on labs from last week.  We discussed lifestyle modifications.

## 2022-04-21 LAB — CYTOLOGY - PAP
Comment: NEGATIVE
Diagnosis: NEGATIVE
High risk HPV: NEGATIVE

## 2022-04-21 NOTE — Progress Notes (Signed)
Please inform patient of the following:  Great news! Pap was normal. She does not need any further pap tests.  Theresa Rivera. Jerline Pain, MD 04/21/2022 1:37 PM

## 2022-04-21 NOTE — Progress Notes (Signed)
Please inform patient of the following:  We are still waiting on a couple of her test to come back/.  Her potassium was up a little bit.  This is probably a lab error.  She can go back to the lab to have a BUN drawn.  Please place future order.  Her cholesterol blood sugar borderline elevated but overall stable.  Do not need to start meds but she should continue to work on diet and exercise.  We can recheck all these in a year. We will contact her once we get results of her remaining tests back.  Algis Greenhouse. Jerline Pain, MD 04/21/2022 8:07 AM

## 2022-04-22 ENCOUNTER — Other Ambulatory Visit: Payer: Self-pay

## 2022-04-22 DIAGNOSIS — E875 Hyperkalemia: Secondary | ICD-10-CM

## 2022-04-23 NOTE — Progress Notes (Signed)
Please inform patient of the following:  Her biopsy result is consistent with urticaria as we discussed at her office visit. The pathologist also commented that the sample we sent could be due to insect bites but this is very unlikely based on her history and distribution of the rash.  Do not need to do any further testing at this point from [place] though it would be a good idea for her to get allergy testing soon as we discussed at her office visit. Would like for her to let us know if her symptoms return.

## 2022-04-24 LAB — CHRONIC URTICARIA: cu index: <2.6 (ref <10)

## 2022-04-27 NOTE — Progress Notes (Signed)
Please inform patient of the following:  Her test for chronic urticaria was negative. She probably an allergic reaction to something that caused her outbreak. Recommend she follow up for allergy testing as we discussed previously.  Algis Greenhouse. Jerline Pain, MD 04/27/2022 7:31 AM

## 2022-05-11 ENCOUNTER — Encounter: Payer: Self-pay | Admitting: Family Medicine

## 2022-05-11 NOTE — Telephone Encounter (Signed)
Please advise 

## 2022-05-11 NOTE — Telephone Encounter (Signed)
Appt schedule for 05/12/2022

## 2022-05-11 NOTE — Telephone Encounter (Signed)
Its hard to tell from the picture but is this rash different than last time?   Can we get in her in for an office visit again ASAP?  Theresa Rivera. Jimmey Ralph, MD 05/11/2022 12:28 PM

## 2022-05-11 NOTE — Telephone Encounter (Signed)
Spoke with patient stated rash look the same as last time, advise to schedule OV ASAP

## 2022-05-12 ENCOUNTER — Encounter: Payer: Self-pay | Admitting: Physician Assistant

## 2022-05-12 ENCOUNTER — Other Ambulatory Visit (HOSPITAL_BASED_OUTPATIENT_CLINIC_OR_DEPARTMENT_OTHER): Payer: Self-pay

## 2022-05-12 ENCOUNTER — Ambulatory Visit (INDEPENDENT_AMBULATORY_CARE_PROVIDER_SITE_OTHER): Payer: 59 | Admitting: Physician Assistant

## 2022-05-12 VITALS — BP 110/68 | HR 75 | Temp 97.7°F | Ht 67.0 in | Wt 164.2 lb

## 2022-05-12 DIAGNOSIS — R21 Rash and other nonspecific skin eruption: Secondary | ICD-10-CM | POA: Diagnosis not present

## 2022-05-12 MED ORDER — TRIAMCINOLONE ACETONIDE 0.5 % EX OINT
TOPICAL_OINTMENT | CUTANEOUS | 3 refills | Status: DC
  Filled 2022-05-12: qty 30, 30d supply, fill #0

## 2022-05-12 NOTE — Patient Instructions (Signed)
It was great to see you!  May use over the counter antihistamines such as Zyrtec (cetirizine), Claritin (loratadine), Allegra (fexofenadine), or Xyzal (levocetirizine) daily as needed. May take twice a day if needed as long as it does not cause drowsiness.   Continue singulair 10  Add pepcid  Add topical triamicinolone  Take care,  Jarold Motto PA-C

## 2022-05-12 NOTE — Progress Notes (Signed)
Theresa Rivera is a 61 y.o. female here for a new problem.  History of Present Illness:   Chief Complaint  Patient presents with   Rash    Pt c/o rash on back, was on steroids for rash finished them on Thursday and now rash back again. Pt c/o itching on back and base of neck.    HPI  History reviewed. No pertinent past medical history.  Rash Patient is complaining of a recurrent itchy rash with no exudate on her back and base of neck. She had an E-Visit with Malva Cogan who prescribed a 2 week course of 10 mg prednisone on 10/03. On 10/18, she returned in office for the same problem. Patient was then prescribed 50 mg Atarax and 0.5% triamcinolone acetonide cream. Patient reports she stopped taking the Atarax because it made her sleepy and the cream did not help with symptoms. On 10/26, the patient was in office and seen by Dr. Jimmey Ralph for the same rash issue and was given 10 mg Singulair, 20 mg oral prednisone x 3 weeks and depomerdol injection. She states that the rash has returned 3 days ago. Patient explains that she consistently takes Allegra.    Social History   Tobacco Use   Smoking status: Never   Smokeless tobacco: Never  Substance Use Topics   Alcohol use: Yes    Alcohol/week: 7.0 standard drinks of alcohol    Types: 7 Glasses of wine per week   Drug use: Never    History reviewed. No pertinent surgical history.  Family History  Problem Relation Age of Onset   Prostate cancer Father    Breast cancer Maternal Grandmother 58    No Known Allergies  Current Medications:   Current Outpatient Medications:    acyclovir (ZOVIRAX) 200 MG capsule, Take 4 capsules (800 mg total) by mouth 2 (two) times daily. Take for 5 days as needed for flares, Disp: 90 capsule, Rfl: 1   fexofenadine (ALLEGRA) 180 MG tablet, Take 180 mg by mouth daily., Disp: , Rfl:    montelukast (SINGULAIR) 10 MG tablet, Take 1 tablet (10 mg total) by mouth at bedtime., Disp: 90 tablet, Rfl:  3   triamcinolone ointment (KENALOG) 0.5 %, Apply to affected area 1-2 times daily, Disp: 30 g, Rfl: 3   Review of Systems:   Review of Systems  Skin:  Positive for rash.    Vitals:   Vitals:   05/12/22 1512  BP: 110/68  Pulse: 75  Temp: 97.7 F (36.5 C)  TempSrc: Temporal  SpO2: 98%  Weight: 164 lb 4 oz (74.5 kg)  Height: 5\' 7"  (1.702 m)     Body mass index is 25.73 kg/m.  Physical Exam:   Physical Exam Constitutional:      Appearance: Normal appearance. She is well-developed.  HENT:     Head: Normocephalic and atraumatic.  Eyes:     General: Lids are normal.     Extraocular Movements: Extraocular movements intact.     Conjunctiva/sclera: Conjunctivae normal.  Pulmonary:     Effort: Pulmonary effort is normal.  Musculoskeletal:        General: Normal range of motion.     Cervical back: Normal range of motion and neck supple.  Skin:    General: Skin is warm and dry.     Comments: Erythematous raised papules to mid-lower back  Neurological:     Mental Status: She is alert and oriented to person, place, and time.  Psychiatric:  Attention and Perception: Attention and perception normal.        Mood and Affect: Mood normal.        Behavior: Behavior normal.        Thought Content: Thought content normal.        Judgment: Judgment normal.     Assessment and Plan:   Rash Will trial topical triamcinolone 0.5% ointment Consider adding pepcid Continue allegra and prn hydroxyzine She wants to avoid systemic steroids due to upcoming allergy appointment in case this conflicts with testing (I called the office and they stated to hold oral steroids for 2 weeks prior to testing and antihistamines for 3 days prior to testing)  I,Verona Buck,acting as a scribe for [employer], PA.,have documented all relevant documentation on the behalf of Jarold Motto, PA,as directed by  Jarold Motto, PA while in the presence of Jarold Motto, [place].  I, Jarold Motto, [place], have reviewed all documentation for this visit. The documentation on 05/12/22 for the exam, diagnosis, procedures, and orders are all accurate and complete.   Jarold Motto, PA-C

## 2022-05-18 NOTE — Progress Notes (Unsigned)
New Patient Note  RE: Theresa Rivera MRN: [id number] DOB: 02-21-1961 Date of Office Visit: 05/19/2022  Consult requested by: Vivi Barrack, MD Primary care provider: Vivi Barrack, MD  Chief Complaint: Rash (Had 3 vaccines at the same time and developed a rash after all over the body. When she comes off steroids the rash comes back. )  History of Present Illness: I had the pleasure of seeing Theresa Rivera for initial evaluation at the [facility] on 05/20/2022. She is a 61 y.o. female, who is referred here by Vivi Barrack, MD for the evaluation of rash.  Patient had Covid-19 on 9/3 and on 9/27 she got 3 vaccines at the same time (RSV, flu and Tdap) and 2 days later she developed a rash.  She was started on oral prednisone which got better but then it came back a few days after she finished the prednisone.  She then was given a steroid injection, oral prednisone and topical cream. It came back again after a few days she finished the prednisone.   Last prednisone dose was 2 weeks ago.   The rash can occur anywhere on her body. Describes them as itchy, red, raised. Individual rashes lasts about 1+ week. Some bruising on the inner thigh.  Associated symptoms include: none.  Frequency of episodes: daily unless taking prednisone. Suspected triggers are unknown but concerned about the vaccines.   Patient had flu shot and Tdap previously with no issues.  Denies any fevers, chills, foods, personal care products or recent infections.  She was taking allegra in the morning and Singulair at night - symptoms worse since off medications.   Previous work up includes: 04/16/2022 skin biopsy showed urticaria? 04/16/2022: CBC, CMP, TSH, CU normal.  Previous history of rash/hives: no. Patient is up to date with the following cancer screening tests: physical exam, pap smears, colonoscopy, mammogram.  Patient would like work up to be done this year as she met  her deductible.   04/16/2022 PCP visit: "Rash Consistent with urticaria.  Not responding to over-the-counter antihistamines.  We will give 80 mg Depo-Medrol today and start 3-week course of prednisone.  Also add on Singulair to her regimen.  Punctuality was performed today.  See below procedure note.  She is getting blood work done today and we will add on a CU index.  She will follow-up with me in a couple weeks for CPE.  If still no improvement she will need to follow-up with dermatology."  04/16/2022 biopsy results:   Assessment and Plan: Theresa Rivera is a 61 y.o. female with: Rash and other nonspecific skin eruption Pruritic rash since September 2023 which only resolves with prednisone. She had Covid-19 and received 3 vaccines (RSV, Flu, Tdap) on the same day and 2 days later developed this rash. No issues previously with flu and tdap. 04/16/2022: CBC, CMP, TSH, CU normal, skin biopsy showed urticaria?. Discussed with patient that given clinical history this is unlikely to be caused by environmental/food allergens - she still would like to be checked as she met her deductible.  I'm more concerned if she had her immune system overstimulated with her recent Covid-19 infection and receiving 3 vaccines at the same time.  Start allegra 180 mg twice a day. If symptoms are not controlled or causes drowsiness let us know. Start Pepcid (famotidine) 70m twice a day.  Continue Singulair (montelukast) 171mdaily at night. Steroid injection given today. Take medrol pak as prescribed.  Avoid the  following potential triggers: alcohol, tight clothing, NSAIDs, hot showers and getting overheated. Recommend in the future only getting 1 vaccine at a time.  Get bloodwork:  If no improvement and bloodwork unremarkable will start Xolair injections next.  Other allergic rhinitis Takes OTC antihistamines in the spring and fall with good benefit.  Get bloodwork as below to check for environmental allergies. The  above antihistamines should also help with these symptoms.   Return in about 2 weeks (around 06/02/2022).  Meds ordered this encounter  Medications   famotidine (PEPCID) 20 MG tablet    Sig: Take 1 tablet (20 mg total) by mouth 2 (two) times daily.    Dispense:  60 tablet    Refill:  2   methylPREDNISolone (MEDROL DOSEPAK) 4 MG TBPK tablet    Sig: Take 6 tablets on day 1, 5 tablets on day 2, 4 tabs on day 3, 3 tabs on day 4, 2 tabs on day 5, 1 tab on day 6.    Dispense:  21 tablet    Refill:  0   methylPREDNISolone acetate (DEPO-MEDROL) injection 80 mg   Lab Orders         C3 and C4         CBC with Differential/Platelet         Alpha-Gal Panel         ANA w/Reflex         Allergens w/Total IgE Area 2         C-reactive protein         Tryptase         Sedimentation rate         Food Allergy Profile      Other allergy screening: Asthma: no Rhino conjunctivitis:  Takes OTC antihistamines in the spring and fall with good benefit.  Food allergy: no Hymenoptera allergy: no Eczema:no History of recurrent infections suggestive of immunodeficency: no  Diagnostics: None.   Past Medical History: Patient Active Problem List   Diagnosis Date Noted   Rash and other nonspecific skin eruption 05/20/2022   Hyperglycemia 04/20/2022   Cold sore 04/18/2021   Dyslipidemia 04/18/2021   Other allergic rhinitis 04/12/2013   History reviewed. No pertinent past medical history. Past Surgical History: History reviewed. No pertinent surgical history. Medication List:  Current Outpatient Medications  Medication Sig Dispense Refill   acyclovir (ZOVIRAX) 200 MG capsule Take 4 capsules (800 mg total) by mouth 2 (two) times daily. Take for 5 days as needed for flares 90 capsule 1   famotidine (PEPCID) 20 MG tablet Take 1 tablet (20 mg total) by mouth 2 (two) times daily. 60 tablet 2   fexofenadine (ALLEGRA) 180 MG tablet Take 180 mg by mouth daily.     methylPREDNISolone (MEDROL DOSEPAK) 4  MG TBPK tablet Take 6 tablets on day 1, 5 tablets on day 2, 4 tabs on day 3, 3 tabs on day 4, 2 tabs on day 5, 1 tab on day 6. 21 tablet 0   montelukast (SINGULAIR) 10 MG tablet Take 1 tablet (10 mg total) by mouth at bedtime. 90 tablet 3   triamcinolone ointment (KENALOG) 0.5 % Apply to affected area 1-2 times daily 30 g 3   No current facility-administered medications for this visit.   Allergies: No Known Allergies Social History: Social History   Socioeconomic History   Marital status: Married    Spouse name: Not on file   Number of children: Not on file   Years of education:   Not on file   Highest education level: Not on file  Occupational History   Not on file  Tobacco Use   Smoking status: Never   Smokeless tobacco: Never  Substance and Sexual Activity   Alcohol use: Yes    Alcohol/week: 7.0 standard drinks of alcohol    Types: 7 Glasses of wine per week   Drug use: Never   Sexual activity: Yes  Other Topics Concern   Not on file  Social History Narrative   Not on file   Social Determinants of Health   Financial Resource Strain: Not on file  Food Insecurity: Not on file  Transportation Needs: Not on file  Physical Activity: Not on file  Stress: Not on file  Social Connections: Not on file   Lives in a Tab. Smoking: denies Occupation: retired  Programme researcher, broadcasting/film/video HistoryFreight forwarder in the house: no Charity fundraiser in the family room: no Carpet in the bedroom: yes Heating: electric Cooling: central Pet: no  Family History: Family History  Problem Relation Age of Onset   Prostate cancer Father    Breast cancer Maternal Grandmother 93   Eczema Son    Review of Systems  Constitutional:  Negative for appetite change, chills, fever and unexpected weight change.  HENT:  Negative for congestion and rhinorrhea.   Eyes:  Negative for itching.  Respiratory:  Negative for cough, chest tightness, shortness of breath and wheezing.   Cardiovascular:  Negative for  chest pain.  Gastrointestinal:  Negative for abdominal pain.  Genitourinary:  Negative for difficulty urinating.  Skin:  Positive for rash.  Neurological:  Negative for headaches.    Objective: BP 122/86   Pulse 88   Temp 98.4 F (36.9 C)   Resp 18   Ht 5' 7" (1.702 m)   Wt 164 lb (74.4 kg)   SpO2 98%   BMI 25.69 kg/m  Body mass index is 25.69 kg/m. Physical Exam Vitals and nursing note reviewed.  Constitutional:      Appearance: Normal appearance. She is well-developed.  HENT:     Head: Normocephalic and atraumatic.     Right Ear: Tympanic membrane and external ear normal.     Left Ear: Tympanic membrane and external ear normal.     Nose: Nose normal.     Mouth/Throat:     Mouth: Mucous membranes are moist.     Pharynx: Oropharynx is clear.  Eyes:     Conjunctiva/sclera: Conjunctivae normal.  Cardiovascular:     Rate and Rhythm: Normal rate and regular rhythm.     Heart sounds: Normal heart sounds. No murmur heard.    No friction rub. No gallop.  Pulmonary:     Effort: Pulmonary effort is normal.     Breath sounds: Normal breath sounds. No wheezing, rhonchi or rales.  Musculoskeletal:     Cervical back: Neck supple.  Skin:    General: Skin is warm.     Findings: Rash present.     Comments: Blanchable, erythematous raised area on posterior torso.  Neurological:     Mental Status: She is alert and oriented to person, place, and time.  Psychiatric:        Behavior: Behavior normal.    The plan was reviewed with the patient/family, and all questions/concerned were addressed.  It was my pleasure to see Shakeda today and participate in her care. Please feel free to contact me with any questions or concerns.  Sincerely,  Rexene Alberts, DO Allergy & Immunology  [facility] office: [place] office: [phone]

## 2022-05-19 ENCOUNTER — Other Ambulatory Visit: Payer: Self-pay | Admitting: Allergy

## 2022-05-19 ENCOUNTER — Other Ambulatory Visit: Payer: Self-pay

## 2022-05-19 ENCOUNTER — Ambulatory Visit (INDEPENDENT_AMBULATORY_CARE_PROVIDER_SITE_OTHER): Payer: 59 | Admitting: Allergy

## 2022-05-19 ENCOUNTER — Other Ambulatory Visit (HOSPITAL_BASED_OUTPATIENT_CLINIC_OR_DEPARTMENT_OTHER): Payer: Self-pay

## 2022-05-19 ENCOUNTER — Encounter: Payer: Self-pay | Admitting: Allergy

## 2022-05-19 VITALS — BP 122/86 | HR 88 | Temp 98.4°F | Resp 18 | Ht 67.0 in | Wt 164.0 lb

## 2022-05-19 DIAGNOSIS — L509 Urticaria, unspecified: Secondary | ICD-10-CM

## 2022-05-19 DIAGNOSIS — J3089 Other allergic rhinitis: Secondary | ICD-10-CM | POA: Diagnosis not present

## 2022-05-19 DIAGNOSIS — R21 Rash and other nonspecific skin eruption: Secondary | ICD-10-CM | POA: Diagnosis not present

## 2022-05-19 MED ORDER — METHYLPREDNISOLONE ACETATE 80 MG/ML IJ SUSP
80.0000 mg | Freq: Once | INTRAMUSCULAR | Status: AC
  Administered 2022-05-19: 80 mg via INTRAMUSCULAR

## 2022-05-19 MED ORDER — FAMOTIDINE 20 MG PO TABS
20.0000 mg | ORAL_TABLET | Freq: Two times a day (BID) | ORAL | 2 refills | Status: DC
  Filled 2022-05-19: qty 60, 30d supply, fill #0
  Filled 2022-08-27: qty 60, 30d supply, fill #1
  Filled 2022-09-21: qty 60, 30d supply, fill #2

## 2022-05-19 MED ORDER — METHYLPREDNISOLONE 4 MG PO TBPK
ORAL_TABLET | ORAL | 0 refills | Status: DC
  Filled 2022-05-19: qty 21, 6d supply, fill #0

## 2022-05-19 NOTE — Patient Instructions (Addendum)
Rash: Difficult to say what triggered this.  Start allegra 180 mg twice a day. If symptoms are not controlled or causes drowsiness let us know. Start pepcid (famotidine) 20mg  twice a day.  Continue Singulair (montelukast) 10mg  daily at night. Steroid injection given today. Take medrol pak as prescribed.   Avoid the following potential triggers: alcohol, tight clothing, NSAIDs, hot showers and getting overheated. Recommend in the future only getting 1 vaccine at a time.  Get bloodwork:  We are ordering labs, so please allow 1-2 weeks for the results to come back. With the newly implemented Cures Act, the labs might be visible to you at the same time that they become visible to me. However, I will not address the results until all of the results are back, so please be patient.    If no improvement and bloodwork unremarkable will start Xolair injections next.  Follow up in 2-3 weeks or sooner if needed.

## 2022-05-20 ENCOUNTER — Encounter: Payer: Self-pay | Admitting: Allergy

## 2022-05-20 DIAGNOSIS — L509 Urticaria, unspecified: Secondary | ICD-10-CM | POA: Insufficient documentation

## 2022-05-20 DIAGNOSIS — R21 Rash and other nonspecific skin eruption: Secondary | ICD-10-CM | POA: Insufficient documentation

## 2022-05-20 NOTE — Assessment & Plan Note (Addendum)
Takes OTC antihistamines in the spring and fall with good benefit.  Get bloodwork as below to check for environmental allergies. The above antihistamines should also help with these symptoms.

## 2022-05-20 NOTE — Assessment & Plan Note (Signed)
Pruritic rash since September 2023 which only resolves with prednisone. She had Covid-19 and received 3 vaccines (RSV, Flu, Tdap) on the same day and 2 days later developed this rash. No issues previously with flu and tdap. 04/16/2022: CBC, CMP, TSH, CU normal, skin biopsy showed urticaria?. Discussed with patient that given clinical history this is unlikely to be caused by environmental/food allergens - she still would like to be checked as she met her deductible.  I'm more concerned if she had her immune system overstimulated with her recent Covid-19 infection and receiving 3 vaccines at the same time.  Start allegra 180 mg twice a day. If symptoms are not controlled or causes drowsiness let us know. Start Pepcid (famotidine) 19m twice a day.  Continue Singulair (montelukast) 149mdaily at night. Steroid injection given today. Take medrol pak as prescribed.  Avoid the following potential triggers: alcohol, tight clothing, NSAIDs, hot showers and getting overheated. Recommend in the future only getting 1 vaccine at a time.  Get bloodwork:  If no improvement and bloodwork unremarkable will start Xolair injections next.

## 2022-05-20 NOTE — Assessment & Plan Note (Signed)
>>  ASSESSMENT AND PLAN FOR RASH AND OTHER NONSPECIFIC SKIN ERUPTION WRITTEN ON 05/20/2022  1:08 PM BY Garnet Sierras, DO  Pruritic rash since September 2023 which only resolves with prednisone. She had Covid-19 and received 3 vaccines (RSV, Flu, Tdap) on the same day and 2 days later developed this rash. No issues previously with flu and tdap. 04/16/2022: CBC, CMP, TSH, CU normal, skin biopsy showed urticaria?. Discussed with patient that given clinical history this is unlikely to be caused by environmental/food allergens - she still would like to be checked as she met her deductible.  I'm more concerned if she had her immune system overstimulated with her recent Covid-19 infection and receiving 3 vaccines at the same time.  Start allegra 180 mg twice a day. If symptoms are not controlled or causes drowsiness let us know. Start Pepcid (famotidine) 25m twice a day.  Continue Singulair (montelukast) 148mdaily at night. Steroid injection given today. Take medrol pak as prescribed.  Avoid the following potential triggers: alcohol, tight clothing, NSAIDs, hot showers and getting overheated. Recommend in the future only getting 1 vaccine at a time.  Get bloodwork:  If no improvement and bloodwork unremarkable will start Xolair injections next.

## 2022-05-22 LAB — FOOD ALLERGY PROFILE
Allergen Corn, IgE: <0.1 kU/L
Clam IgE: <0.1 kU/L
Codfish IgE: <0.1 kU/L
Egg White IgE: <0.1 kU/L
Milk IgE: <0.1 kU/L
Peanut IgE: <0.1 kU/L
Scallop IgE: <0.1 kU/L
Sesame Seed IgE: <0.1 kU/L
Shrimp IgE: <0.1 kU/L
Soybean IgE: <0.1 kU/L
Walnut IgE: <0.1 kU/L
Wheat IgE: <0.1 kU/L

## 2022-05-22 LAB — ALLERGENS W/TOTAL IGE AREA 2

## 2022-05-22 LAB — CBC WITH DIFFERENTIAL/PLATELET
Basophils Absolute: 0 10*3/uL (ref 0.0–0.2)
Basos: 0 %
EOS (ABSOLUTE): 0.5 10*3/uL — ABNORMAL HIGH (ref 0.0–0.4)
Eos: 8 %
Hematocrit: 36 % (ref 34.0–46.6)
Hemoglobin: 12.9 g/dL (ref 11.1–15.9)
Immature Grans (Abs): 0 10*3/uL (ref 0.0–0.1)
Immature Granulocytes: 0 %
Lymphocytes Absolute: 1.5 10*3/uL (ref 0.7–3.1)
Lymphs: 23 %
MCH: 33 pg (ref 26.6–33.0)
MCHC: 35.8 g/dL — ABNORMAL HIGH (ref 31.5–35.7)
MCV: 92 fL (ref 79–97)
Monocytes Absolute: 0.6 10*3/uL (ref 0.1–0.9)
Monocytes: 9 %
Neutrophils Absolute: 3.8 10*3/uL (ref 1.4–7.0)
Neutrophils: 60 %
Platelets: 349 10*3/uL (ref 150–450)
RBC: 3.91 x10E6/uL (ref 3.77–5.28)
RDW: 12.5 % (ref 11.7–15.4)
WBC: 6.4 10*3/uL (ref 3.4–10.8)

## 2022-05-22 LAB — ALPHA-GAL PANEL
Allergen Lamb IgE: <0.1 kU/L
Beef IgE: <0.1 kU/L
IgE (Immunoglobulin E), Serum: 73 IU/mL (ref 6–495)
O215-IgE Alpha-Gal: <0.1 kU/L
Pork IgE: <0.1 kU/L

## 2022-05-22 LAB — SEDIMENTATION RATE: Sed Rate: 5 mm/hr (ref 0–40)

## 2022-05-22 LAB — TRYPTASE: Tryptase: 8.9 ug/L (ref 2.2–13.2)

## 2022-05-22 LAB — C3 AND C4
Complement C3, Serum: 125 mg/dL (ref 82–167)
Complement C4, Serum: 26 mg/dL (ref 12–38)

## 2022-05-22 LAB — C-REACTIVE PROTEIN: CRP: <1 mg/L (ref 0–10)

## 2022-05-22 LAB — ANA W/REFLEX: Anti Nuclear Antibody (ANA): NEGATIVE

## 2022-06-02 ENCOUNTER — Encounter: Payer: Self-pay | Admitting: Allergy

## 2022-06-02 ENCOUNTER — Ambulatory Visit (INDEPENDENT_AMBULATORY_CARE_PROVIDER_SITE_OTHER): Payer: 59 | Admitting: Allergy

## 2022-06-02 ENCOUNTER — Other Ambulatory Visit (HOSPITAL_BASED_OUTPATIENT_CLINIC_OR_DEPARTMENT_OTHER): Payer: Self-pay

## 2022-06-02 VITALS — BP 126/90 | HR 100 | Temp 97.6°F | Resp 16

## 2022-06-02 DIAGNOSIS — L509 Urticaria, unspecified: Secondary | ICD-10-CM | POA: Diagnosis not present

## 2022-06-02 MED ORDER — FEXOFENADINE HCL 180 MG PO TABS
360.0000 mg | ORAL_TABLET | Freq: Two times a day (BID) | ORAL | 3 refills | Status: DC
  Filled 2022-06-02 – 2022-09-21 (×2): qty 100, 25d supply, fill #0
  Filled 2022-10-20: qty 100, 25d supply, fill #1
  Filled 2022-11-19: qty 100, 25d supply, fill #2

## 2022-06-02 NOTE — Progress Notes (Signed)
Follow Up Note  RE: Theresa Rivera MRN: [id number] DOB: [birth year] Date of Office Visit: 06/02/2022  Referring provider: Vivi Barrack, MD Primary care provider: Vivi Barrack, MD  Chief Complaint: Rash (Her rash is still going on mostly on back and scalp. )  History of Present Illness: I had the pleasure of seeing Theresa Rivera for a follow up visit at the [facility] on 06/02/2022. She is a 61 y.o. female, who is being followed for rash and allergic rhinitis. Her previous allergy office visit was on 05/19/2022 with Dr. Maudie Rivera. Today is a regular follow up visit.  Rash  Still breaking out daily mainly on the back and the scalp. Maybe worse at night.  Taking allegra 158m BID, famotidine 23mBID and Singulair daily. Resolved while on steroids but returned once completed course. Not as bad as before though. Wants to see if insurance covers [facility] starting.   Assessment and Plan: Theresa Rivera a 89.o. female with: Urticaria Past history - Pruritic rash since September 2023 which only resolves with prednisone. She had Covid-19 and received 3 vaccines (RSV, Flu, Tdap) on the same day and 2 days later developed this rash. No issues previously with flu and tdap. 04/16/2022: CBC, CMP, TSH, CU normal, skin biopsy showed urticaria? Interim history - 2023 Crp, Esr, ANA, tryptase, C3, C4, alpha gal, CBC diff normal, negative food/environmental panel. Hives resolved while on prednisone but still having daily episodes mainly on scalp and back. Not as bad as before.  Start allegra 180 mg (2 tablets) twice a day. If symptoms are not controlled or causes drowsiness let usKoreanow. Continue Pepcid (famotidine) 2025mwice a day.  Continue Singulair (montelukast) 48m73mily at night. Avoid the following potential triggers: alcohol, tight clothing, NSAIDs, hot showers and getting overheated. Recommend in the future only getting 1 vaccine at a time.  If no  improvement will start Xolair injections next. Theresa Rivera will be in touch with you regarding coverage.   Return in about 4 weeks (around 06/30/2022).  Meds ordered this encounter  Medications   fexofenadine (ALLEGRA ALLERGY) 180 MG tablet    Sig: Take 2 tablets (360 mg total) by mouth in the morning and at bedtime.    Dispense:  120 tablet    Refill:  3    Okay higher dose for hives.   Lab Orders  No laboratory test(s) ordered today    Diagnostics: None.   Medication List:  Current Outpatient Medications  Medication Sig Dispense Refill   acyclovir (ZOVIRAX) 200 MG capsule Take 4 capsules (800 mg total) by mouth 2 (two) times daily. Take for 5 days as needed for flares 90 capsule 1   famotidine (PEPCID) 20 MG tablet Take 1 tablet (20 mg total) by mouth 2 (two) times daily. 60 tablet 2   fexofenadine (ALLEGRA ALLERGY) 180 MG tablet Take 2 tablets (360 mg total) by mouth in the morning and at bedtime. 120 tablet 3   montelukast (SINGULAIR) 10 MG tablet Take 1 tablet (10 mg total) by mouth at bedtime. 90 tablet 3   No current facility-administered medications for this visit.   Allergies: No Known Allergies I reviewed her past medical history, social history, family history, and environmental history and no significant changes have been reported from her previous visit.  Review of Systems  Constitutional:  Negative for appetite change, chills, fever and unexpected weight change.  HENT:  Negative for congestion and rhinorrhea.   Eyes:  Negative for itching.  Respiratory:  Negative for cough, chest tightness, shortness of breath and wheezing.   Cardiovascular:  Negative for chest pain.  Gastrointestinal:  Negative for abdominal pain.  Genitourinary:  Negative for difficulty urinating.  Skin:  Positive for rash.  Neurological:  Negative for headaches.    Objective: BP (!) 126/90   Pulse 100   Temp 97.6 F (36.4 C)   Resp 16   SpO2 97%  There is no height or weight on file to  calculate BMI. Physical Exam Vitals and nursing note reviewed.  Constitutional:      Appearance: Normal appearance. She is well-developed.  HENT:     Head: Normocephalic and atraumatic.     Right Ear: Tympanic membrane and external ear normal.     Left Ear: Tympanic membrane and external ear normal.     Nose: Nose normal.     Mouth/Throat:     Mouth: Mucous membranes are moist.     Pharynx: Oropharynx is clear.  Eyes:     Conjunctiva/sclera: Conjunctivae normal.  Cardiovascular:     Rate and Rhythm: Normal rate and regular rhythm.     Heart sounds: Normal heart sounds. No murmur heard.    No friction rub. No gallop.  Pulmonary:     Effort: Pulmonary effort is normal.     Breath sounds: Normal breath sounds. No wheezing, rhonchi or rales.  Musculoskeletal:     Cervical back: Neck supple.  Skin:    General: Skin is warm.     Findings: Rash present.     Comments: Hives noted on posterior scalp.   Neurological:     Mental Status: She is alert and oriented to person, place, and time.  Psychiatric:        Behavior: Behavior normal.    Previous notes and tests were reviewed. The plan was reviewed with the patient/family, and all questions/concerned were addressed.  It was my pleasure to see Theresa Rivera today and participate in her care. Please feel free to contact me with any questions or concerns.  Sincerely,  Rexene Alberts, DO Allergy & Immunology  [facility] office: [place] office: [phone]

## 2022-06-02 NOTE — Assessment & Plan Note (Signed)
Past history - Pruritic rash since September 2023 which only resolves with prednisone. She had Covid-19 and received 3 vaccines (RSV, Flu, Tdap) on the same day and 2 days later developed this rash. No issues previously with flu and tdap. 04/16/2022: CBC, CMP, TSH, CU normal, skin biopsy showed urticaria? Interim history - 2023 Crp, Esr, ANA, tryptase, C3, C4, alpha gal, CBC diff normal, negative food/environmental panel. Hives resolved while on prednisone but still having daily episodes mainly on scalp and back. Not as bad as before.  Start allegra 180 mg (2 tablets) twice a day. If symptoms are not controlled or causes drowsiness let us know. Continue Pepcid (famotidine) 46m twice a day.  Continue Singulair (montelukast) 153mdaily at night. Avoid the following potential triggers: alcohol, tight clothing, NSAIDs, hot showers and getting overheated. Recommend in the future only getting 1 vaccine at a time.  If no improvement will start Xolair injections next. Tammy will be in touch with you regarding coverage.

## 2022-06-02 NOTE — Patient Instructions (Addendum)
Rash: Start allegra 180 mg (2 tablets) twice a day. If symptoms are not controlled or causes drowsiness let us know. Continue Pepcid (famotidine) 20mg  twice a day.  Continue Singulair (montelukast) 10mg  daily at night. Avoid the following potential triggers: alcohol, tight clothing, NSAIDs, hot showers and getting overheated. Recommend in the future only getting 1 vaccine at a time.  If no improvement will start Xolair injections next. Tammy will be in touch with you regarding coverage.   Follow up in 1 month or sooner if needed.

## 2022-06-03 ENCOUNTER — Other Ambulatory Visit (HOSPITAL_BASED_OUTPATIENT_CLINIC_OR_DEPARTMENT_OTHER): Payer: Self-pay

## 2022-06-06 ENCOUNTER — Other Ambulatory Visit (HOSPITAL_BASED_OUTPATIENT_CLINIC_OR_DEPARTMENT_OTHER): Payer: Self-pay

## 2022-06-08 ENCOUNTER — Telehealth: Payer: Self-pay | Admitting: *Deleted

## 2022-06-08 NOTE — Telephone Encounter (Signed)
Patient called back and l/m I reached back out to her

## 2022-06-08 NOTE — Telephone Encounter (Signed)
L/m for patient to contact me to advise approval, copay card $0 for drug and submit to [employer] if patient wants to proceed

## 2022-06-08 NOTE — Telephone Encounter (Signed)
-----   Message from Ellamae Sia, DO sent at 06/02/2022  3:18 PM EST ----- Debbie Bellucci, can you check her insurance regarding Xolair 300mg  every 4 weeks coverage for hives? She doesn't want to start if too expensive. Thank you.

## 2022-06-09 NOTE — Telephone Encounter (Signed)
Spoke to patient and advised with approval and copay card for Xolair she should have $0 copay for same. Patient advised she wants to wait until she has followup appt to make decision to start therapy

## 2022-06-11 ENCOUNTER — Ambulatory Visit
Admission: RE | Admit: 2022-06-11 | Discharge: 2022-06-11 | Disposition: A | Discharge status: Home / Self Care | Payer: 59 | Source: Ambulatory Visit | Attending: Family Medicine | Admitting: Family Medicine

## 2022-06-11 ENCOUNTER — Other Ambulatory Visit (HOSPITAL_BASED_OUTPATIENT_CLINIC_OR_DEPARTMENT_OTHER): Payer: Self-pay

## 2022-06-11 DIAGNOSIS — Z1231 Encounter for screening mammogram for malignant neoplasm of breast: Secondary | ICD-10-CM

## 2022-06-11 MED ORDER — COMIRNATY 30 MCG/0.3ML IM SUSY
PREFILLED_SYRINGE | INTRAMUSCULAR | 0 refills | Status: DC
  Filled 2022-06-11: qty 0.3, 1d supply, fill #0

## 2022-06-29 NOTE — Progress Notes (Unsigned)
Follow Up Note  RE: Theresa Rivera MRN: [id number] DOB: August 05, 1960 Date of Office Visit: 06/30/2022  Referring provider: Ardith Dark, MD Primary care provider: Ardith Dark, MD  Chief Complaint: Rash  History of Present Illness: I had the pleasure of seeing Theresa Rivera for a follow up visit at the [facility] on 06/30/2022. She is a 62 y.o. female, who is being followed for urticaria. Her previous allergy office visit was on 06/02/2022 with Dr. Selena Batten. Today is a regular follow up visit.  Urticaria Currently taking allegra 180mg  2 tablets BID, famotidine 20mg  BID, Singulair 10mg  daily. Noticed about 50% improvement in symptoms but still having rash/hives on her scalp and itching that wakes her up at dawn time.   Interested in starting Xolair injections.  Concerned about side effects - hair loss which occurs on rare occasions. Reviewed side effects.   Assessment and Plan: Theresa Rivera is a 62 y.o. female with: Urticaria Past history - Pruritic rash since September 2023 which only resolves with prednisone. She had Covid-19 and received 3 vaccines (RSV, Flu, Tdap) on the same day and 2 days later developed this rash. No issues previously with flu and tdap. 04/16/2022: CBC, CMP, TSH, CU normal, skin biopsy showed urticaria? 2023 Crp, Esr, ANA, tryptase, C3, C4, alpha gal, CBC diff normal, negative food/environmental panel.   Interim history - about 50% improvement in symptoms but still having daily rash/itching on the scalp. Interested in starting Xolair injections.  Continue allegra 180 mg (2 tablets) twice a day. If symptoms are not controlled or causes drowsiness let October 2023 know. Continue Pepcid (famotidine) 20mg  twice a day.  Continue Singulair (montelukast) 10mg  daily at night. Avoid the following potential triggers: alcohol, tight clothing, NSAIDs, hot showers and getting overheated. Recommend in the future only getting 1 vaccine at a time.  Start  Xolair 300mg  injections every 4 weeks - sample given in the office. Consent was signed and discussed risks/benefits.  Make next appointment for [facility] office. I have prescribed epinephrine injectable device and demonstrated proper use. For mild symptoms you can take over the counter antihistamines such as Benadryl 1-2 tablets = 25-50mg .and monitor symptoms closely. If symptoms worsen or if you have severe symptoms including breathing issues, throat closure, significant swelling, whole body hives, severe diarrhea and vomiting, lightheadedness then inject epinephrine and seek immediate medical care afterwards. Emergency action plan given. If doing well then will discuss stepping down therapy at next visit.   Return in about 3 months (around 09/29/2022).  Meds ordered this encounter  Medications   EPINEPHrine 0.3 mg/0.3 mL IJ SOAJ injection    Sig: Inject 0.3 mg into the muscle as needed for anaphylaxis.    Dispense:  2 each    Refill:  1    May dispense generic/Mylan/Teva brand.   omalizumab [place]) injection 300 mg   Lab Orders  No laboratory test(s) ordered today    Diagnostics: None.   Medication List:  Current Outpatient Medications  Medication Sig Dispense Refill   acyclovir (ZOVIRAX) 200 MG capsule Take 4 capsules (800 mg total) by mouth 2 (two) times daily. Take for 5 days as needed for flares 90 capsule 1   EPINEPHrine 0.3 mg/0.3 mL IJ SOAJ injection Inject 0.3 mg into the muscle as needed for anaphylaxis. 2 each 1   famotidine (PEPCID) 20 MG tablet Take 1 tablet (20 mg total) by mouth 2 (two) times daily. 60 tablet 2   fexofenadine (ALLEGRA ALLERGY) 180 MG tablet  Take 2 tablets (360 mg total) by mouth in the morning and at bedtime. 120 tablet 3   montelukast (SINGULAIR) 10 MG tablet Take 1 tablet (10 mg total) by mouth at bedtime. 90 tablet 3   Current Facility-Administered Medications  Medication Dose Route Frequency Provider Last Rate Last Admin   omalizumab Arvid Right)  injection 300 mg  300 mg Subcutaneous Q28 days Garnet Sierras, DO   300 mg at 06/30/22 1133   Allergies: No Known Allergies I reviewed her past medical history, social history, family history, and environmental history and no significant changes have been reported from her previous visit.  Review of Systems  Constitutional:  Negative for appetite change, chills, fever and unexpected weight change.  HENT:  Negative for congestion and rhinorrhea.   Eyes:  Negative for itching.  Respiratory:  Negative for cough, chest tightness, shortness of breath and wheezing.   Cardiovascular:  Negative for chest pain.  Gastrointestinal:  Negative for abdominal pain.  Genitourinary:  Negative for difficulty urinating.  Skin:  Positive for rash.  Neurological:  Negative for headaches.    Objective: BP 110/68   Pulse 97   Resp 18   SpO2 96%  There is no height or weight on file to calculate BMI. Physical Exam Vitals and nursing note reviewed.  Constitutional:      Appearance: Normal appearance. She is well-developed.  HENT:     Head: Normocephalic and atraumatic.     Right Ear: Tympanic membrane and external ear normal.     Left Ear: Tympanic membrane and external ear normal.     Nose: Nose normal.     Mouth/Throat:     Mouth: Mucous membranes are moist.     Pharynx: Oropharynx is clear.  Eyes:     Conjunctiva/sclera: Conjunctivae normal.  Cardiovascular:     Rate and Rhythm: Normal rate and regular rhythm.     Heart sounds: Normal heart sounds. No murmur heard.    No friction rub. No gallop.  Pulmonary:     Effort: Pulmonary effort is normal.     Breath sounds: Normal breath sounds. No wheezing, rhonchi or rales.  Musculoskeletal:     Cervical back: Neck supple.  Skin:    General: Skin is warm and dry.     Findings: Rash present.     Comments: Hives noted on posterior scalp. Dry, rough skin on the back.   Neurological:     Mental Status: She is alert and oriented to person, place, and  time.  Psychiatric:        Behavior: Behavior normal.    Previous notes and tests were reviewed. The plan was reviewed with the patient/family, and all questions/concerned were addressed.  It was my pleasure to see Theresa Rivera today and participate in her care. Please feel free to contact me with any questions or concerns.  Sincerely,  Rexene Alberts, DO Allergy & Immunology  [facility] office: [place] office: [phone]

## 2022-06-30 ENCOUNTER — Ambulatory Visit (INDEPENDENT_AMBULATORY_CARE_PROVIDER_SITE_OTHER): Payer: 59 | Admitting: Allergy

## 2022-06-30 ENCOUNTER — Other Ambulatory Visit (HOSPITAL_BASED_OUTPATIENT_CLINIC_OR_DEPARTMENT_OTHER): Payer: Self-pay

## 2022-06-30 ENCOUNTER — Encounter: Payer: Self-pay | Admitting: Allergy

## 2022-06-30 VITALS — BP 110/68 | HR 97 | Resp 18

## 2022-06-30 DIAGNOSIS — L509 Urticaria, unspecified: Secondary | ICD-10-CM

## 2022-06-30 DIAGNOSIS — L501 Idiopathic urticaria: Secondary | ICD-10-CM | POA: Diagnosis not present

## 2022-06-30 MED ORDER — OMALIZUMAB 150 MG ~~LOC~~ SOLR
300.0000 mg | SUBCUTANEOUS | Status: DC
  Administered 2022-06-30 – 2022-11-09 (×6): 300 mg via SUBCUTANEOUS

## 2022-06-30 MED ORDER — EPINEPHRINE 0.3 MG/0.3ML IJ SOAJ
0.3000 mg | INTRAMUSCULAR | 1 refills | Status: DC | PRN
  Filled 2022-06-30: qty 2, 30d supply, fill #0

## 2022-06-30 NOTE — Assessment & Plan Note (Signed)
Past history - Pruritic rash since September 2023 which only resolves with prednisone. She had Covid-19 and received 3 vaccines (RSV, Flu, Tdap) on the same day and 2 days later developed this rash. No issues previously with flu and tdap. 04/16/2022: CBC, CMP, TSH, CU normal, skin biopsy showed urticaria? 2023 Crp, Esr, ANA, tryptase, C3, C4, alpha gal, CBC diff normal, negative food/environmental panel.   Interim history - about 50% improvement in symptoms but still having daily rash/itching on the scalp. Interested in starting Xolair injections.  Continue allegra 180 mg (2 tablets) twice a day. If symptoms are not controlled or causes drowsiness let us know. Continue Pepcid (famotidine) 20mg  twice a day.  Continue Singulair (montelukast) 10mg  daily at night. Avoid the following potential triggers: alcohol, tight clothing, NSAIDs, hot showers and getting overheated. Recommend in the future only getting 1 vaccine at a time.  Start Xolair 300mg  injections every 4 weeks - sample given in the office. Consent was signed and discussed risks/benefits.  Make next appointment for [facility] office. I have prescribed epinephrine injectable device and demonstrated proper use. For mild symptoms you can take over the counter antihistamines such as Benadryl 1-2 tablets = 25-50mg .and monitor symptoms closely. If symptoms worsen or if you have severe symptoms including breathing issues, throat closure, significant swelling, whole body hives, severe diarrhea and vomiting, lightheadedness then inject epinephrine and seek immediate medical care afterwards. Emergency action plan given. If doing well then will discuss stepping down therapy at next visit.

## 2022-06-30 NOTE — Patient Instructions (Addendum)
Rash: Continue allegra 180 mg (2 tablets) twice a day. If symptoms are not controlled or causes drowsiness let us know. Continue Pepcid (famotidine) 20mg  twice a day.  Continue Singulair (montelukast) 10mg  daily at night. Avoid the following potential triggers: alcohol, tight clothing, NSAIDs, hot showers and getting overheated. Recommend in the future only getting 1 vaccine at a time.  Start Xolair 300mg  injections every 4 weeks - sample given in the office. Make next appointment for [facility] office. I have prescribed epinephrine injectable device and demonstrated proper use. For mild symptoms you can take over the counter antihistamines such as Benadryl 1-2 tablets = 25-50mg .and monitor symptoms closely. If symptoms worsen or if you have severe symptoms including breathing issues, throat closure, significant swelling, whole body hives, severe diarrhea and vomiting, lightheadedness then inject epinephrine and seek immediate medical care afterwards. Emergency action plan given.  Follow up in 3 month or sooner if needed.  If doing well then will discuss stepping down therapy at next visit.

## 2022-07-03 ENCOUNTER — Telehealth: Payer: Self-pay | Admitting: *Deleted

## 2022-07-03 NOTE — Telephone Encounter (Signed)
I spoke to patient previously on 12/18 and advised submit. I called patient back to apologize that I had not submitted her info to [employer] but have today

## 2022-07-03 NOTE — Telephone Encounter (Signed)
-----  Message from Garnet Sierras, DO sent at 06/30/2022 12:53 PM EST ----- We are starting xolair 300mg  every 4 weeks - sample dose given today. Thank you.

## 2022-07-03 NOTE — Telephone Encounter (Signed)
Called patient and got her [employer] info to initiate approval for Xolair

## 2022-07-29 ENCOUNTER — Ambulatory Visit: Payer: 59

## 2022-08-03 ENCOUNTER — Ambulatory Visit (INDEPENDENT_AMBULATORY_CARE_PROVIDER_SITE_OTHER): Payer: 59 | Admitting: *Deleted

## 2022-08-03 DIAGNOSIS — L501 Idiopathic urticaria: Secondary | ICD-10-CM

## 2022-08-27 ENCOUNTER — Other Ambulatory Visit (HOSPITAL_BASED_OUTPATIENT_CLINIC_OR_DEPARTMENT_OTHER): Payer: Self-pay

## 2022-08-30 ENCOUNTER — Encounter: Payer: Self-pay | Admitting: Allergy

## 2022-08-31 ENCOUNTER — Ambulatory Visit (INDEPENDENT_AMBULATORY_CARE_PROVIDER_SITE_OTHER): Payer: 59 | Admitting: *Deleted

## 2022-08-31 ENCOUNTER — Telehealth: Payer: Self-pay | Admitting: *Deleted

## 2022-08-31 DIAGNOSIS — L501 Idiopathic urticaria: Secondary | ICD-10-CM | POA: Diagnosis not present

## 2022-08-31 NOTE — Telephone Encounter (Signed)
Please see phone encounter from 08/31/22.

## 2022-08-31 NOTE — Telephone Encounter (Signed)
I called the patient and went over the previous note from Dr. Maudie Mercury. Patient plans to call back to schedule a follow up appointment. She has a shot on 09/28/22 @ 10:30am is it ok if I work her in the schedule for that day?

## 2022-08-31 NOTE — Telephone Encounter (Signed)
Patient made an appointment for 10/12/22 with Dr. Maudie Mercury

## 2022-08-31 NOTE — Telephone Encounter (Signed)
Patient was wondering if she would be able to donate blood while on Xolair, she states that she does the process where they take blood out and then put it back in cycles.

## 2022-08-31 NOTE — Telephone Encounter (Signed)
Okay. Just let her know she is double booked for that day so may be a little wait.   Otherwise she can also see our nurse practitioner Chrissie that day.  Thank you.

## 2022-08-31 NOTE — Telephone Encounter (Signed)
Please call patient.  Xolair is not an immunosuppressant so I don't see why not.  The people who would know the answer is the place where she is planning to donate the blood.  They usually have a list of requirements.   The only thing I would watch out for if it's flare her hives.   She also sent me a mychart message regarding how long to continue - continue all meds until she has a follow up with me. I recommended a follow up in April - please schedule.  Stopping medications prematurely can cause rebound hives.  Thank you.

## 2022-09-16 ENCOUNTER — Telehealth: Payer: Self-pay

## 2022-09-16 NOTE — Telephone Encounter (Signed)
Received faxed medication delivery notification for patient's Xolair SD PFS 150 mg/mL from [facility]:  Delivery date: 09/17/22   Forwarding message to Tammy as update.

## 2022-09-22 ENCOUNTER — Other Ambulatory Visit (HOSPITAL_BASED_OUTPATIENT_CLINIC_OR_DEPARTMENT_OTHER): Payer: Self-pay

## 2022-09-28 ENCOUNTER — Ambulatory Visit (INDEPENDENT_AMBULATORY_CARE_PROVIDER_SITE_OTHER): Payer: 59 | Admitting: *Deleted

## 2022-09-28 DIAGNOSIS — L501 Idiopathic urticaria: Secondary | ICD-10-CM | POA: Diagnosis not present

## 2022-09-29 ENCOUNTER — Encounter: Payer: Self-pay | Admitting: Allergy

## 2022-09-29 ENCOUNTER — Other Ambulatory Visit: Payer: Self-pay

## 2022-09-29 ENCOUNTER — Ambulatory Visit (INDEPENDENT_AMBULATORY_CARE_PROVIDER_SITE_OTHER): Payer: 59 | Admitting: Allergy

## 2022-09-29 VITALS — BP 102/76 | HR 75 | Temp 98.0°F | Resp 16 | Ht 66.0 in | Wt 171.2 lb

## 2022-09-29 DIAGNOSIS — L509 Urticaria, unspecified: Secondary | ICD-10-CM

## 2022-09-29 NOTE — Patient Instructions (Addendum)
Rash: Move Xolair 300mg  injections to every 3 weeks - 4/29 Stop Singulair. Continue allegra and famotidine.  If no hives/itching for 2 weeks then: Decrease Pepcid to 20mg  once a day. Continue with allegra 2 tablets twice a day. If no symptoms for 2 weeks then: Decrease allegra to 1 tablet twice a day. Continue Pepcid 20mg  once a day. If no symptoms for 2 weeks then: Stop Pepcid. Continue allegra 180mg  once a day. If no symptoms for 2 weeks then: Stop allegra. If you get symptoms then go back to the dose where you didn't have any symptoms.   Avoid the following potential triggers: alcohol, tight clothing, NSAIDs, hot showers and getting overheated. Recommend in the future only getting 1 vaccine at a time.   Follow up in 4 month or sooner if needed.

## 2022-09-29 NOTE — Assessment & Plan Note (Signed)
Past history - Pruritic rash since September 2023 which only resolves with prednisone. She had Covid-19 and received 3 vaccines (RSV, Flu, Tdap) on the same day and 2 days later developed this rash. No issues previously with flu and tdap. 04/16/2022: CBC, CMP, TSH, CU normal, skin biopsy showed urticaria? 2023 Crp, Esr, ANA, tryptase, C3, C4, alpha gal, CBC diff normal, negative food/environmental panel.   Interim history - started Xolair 300mg  every 4 weeks in Jan 2024. Doing better but noticing few hives 4-5 days before due for injections.  Move Xolair 300mg  injections to every 3 weeks.  Stop Singulair. Continue allegra and famotidine.  If no hives/itching for 2 weeks then: Decrease Pepcid to 20mg  once a day. Continue with allegra 2 tablets twice a day. If no symptoms for 2 weeks then: Decrease allegra to 1 tablet twice a day. Continue Pepcid 20mg  once a day. If no symptoms for 2 weeks then: Stop Pepcid. Continue allegra 180mg  once a day. If no symptoms for 2 weeks then: Stop allegra. If you get symptoms then go back to the dose where you didn't have any symptoms.  Avoid the following potential triggers: alcohol, tight clothing, NSAIDs, hot showers and getting overheated. Recommend in the future only getting 1 vaccine at a time.

## 2022-09-29 NOTE — Progress Notes (Signed)
Follow Up Note  RE: Theresa Rivera MRN: [id number] DOB: 04-Nov-1960 Date of Office Visit: 09/29/2022  Referring provider: Ardith Dark, MD Primary care provider: Ardith Dark, MD  Chief Complaint: Follow-up (Has some spots with hives on her body, is not having as much and not as bad. )  History of Present Illness: I had the pleasure of seeing Theresa Rivera for a follow up visit at the [facility] on 09/29/2022. She is a 62 y.o. female, who is being followed for urticaria on Xolair. Her previous allergy office visit was on 06/30/2022 with Dr. Selena Rivera. Today is a regular follow up visit.  Urticaria Currently on Xolair 300mg  every 4 weeks and noticed some hive outbreaks about 4-5 days before due for injections.  No issues with the injections.  Currently taking allegra 2 tablets BID, famotidine 20mg  BID, Singulair at night.  Assessment and Plan: Theresa Rivera is a 62 y.o. female with: Urticaria Past history - Pruritic rash since September 2023 which only resolves with prednisone. She had Covid-19 and received 3 vaccines (RSV, Flu, Tdap) on the same day and 2 days later developed this rash. No issues previously with flu and tdap. 04/16/2022: CBC, CMP, TSH, CU normal, skin biopsy showed urticaria? 2023 Crp, Esr, ANA, tryptase, C3, C4, alpha gal, CBC diff normal, negative food/environmental panel.   Interim history - started Xolair 300mg  every 4 weeks in Jan 2024. Doing better but noticing few hives 4-5 days before due for injections.  Move Xolair 300mg  injections to every 3 weeks.  Stop Singulair. Continue allegra and famotidine.  If no hives/itching for 2 weeks then: Decrease Pepcid to 20mg  once a day. Continue with allegra 2 tablets twice a day. If no symptoms for 2 weeks then: Decrease allegra to 1 tablet twice a day. Continue Pepcid 20mg  once a day. If no symptoms for 2 weeks then: Stop Pepcid. Continue allegra 180mg  once a day. If no symptoms for 2 weeks  then: Stop allegra. If you get symptoms then go back to the dose where you didn't have any symptoms.  Avoid the following potential triggers: alcohol, tight clothing, NSAIDs, hot showers and getting overheated. Recommend in the future only getting 1 vaccine at a time.   Return in about 4 months (around 01/29/2023).  No orders of the defined types were placed in this encounter.  Lab Orders  No laboratory test(s) ordered today    Diagnostics: None.   Medication List:  Current Outpatient Medications  Medication Sig Dispense Refill   acyclovir (ZOVIRAX) 200 MG capsule Take 4 capsules (800 mg total) by mouth 2 (two) times daily. Take for 5 days as needed for flares 90 capsule 1   EPINEPHrine 0.3 mg/0.3 mL IJ SOAJ injection Inject 0.3 mg into the muscle as needed for anaphylaxis. 2 each 1   famotidine (PEPCID) 20 MG tablet Take 1 tablet (20 mg total) by mouth 2 (two) times daily. 60 tablet 2   fexofenadine (ALLEGRA ALLERGY) 180 MG tablet Take 2 tablets (360 mg total) by mouth in the morning and at bedtime. 120 tablet 3   montelukast (SINGULAIR) 10 MG tablet Take 1 tablet (10 mg total) by mouth at bedtime. 90 tablet 3   Current Facility-Administered Medications  Medication Dose Route Frequency Provider Last Rate Last Admin   omalizumab Geoffry [place]) injection 300 mg  300 mg Subcutaneous Q28 days Ellamae Sia, DO   300 mg at 09/28/22 1057   Allergies: No Known Allergies I reviewed her  past medical history, social history, family history, and environmental history and no significant changes have been reported from her previous visit.  Review of Systems  Constitutional:  Negative for appetite change, chills, fever and unexpected weight change.  HENT:  Negative for congestion and rhinorrhea.   Eyes:  Negative for itching.  Respiratory:  Negative for cough, chest tightness, shortness of breath and wheezing.   Cardiovascular:  Negative for chest pain.  Gastrointestinal:  Negative for abdominal  pain.  Genitourinary:  Negative for difficulty urinating.  Skin:  Positive for rash.  Neurological:  Negative for headaches.    Objective: BP 102/76   Pulse 75   Temp 98 F (36.7 C)   Resp 16   Ht 5\' 6"  (1.676 m)   Wt 171 lb 4 oz (77.7 kg)   SpO2 99%   BMI 27.64 kg/m  Body mass index is 27.64 kg/m. Physical Exam Vitals and nursing note reviewed.  Constitutional:      Appearance: Normal appearance. She is well-developed.  HENT:     Head: Normocephalic and atraumatic.     Right Ear: Tympanic membrane and external ear normal.     Left Ear: Tympanic membrane and external ear normal.     Nose: Nose normal.     Mouth/Throat:     Mouth: Mucous membranes are moist.     Pharynx: Oropharynx is clear.  Eyes:     Conjunctiva/sclera: Conjunctivae normal.  Cardiovascular:     Rate and Rhythm: Normal rate and regular rhythm.     Heart sounds: Normal heart sounds. No murmur heard.    No friction rub. No gallop.  Pulmonary:     Effort: Pulmonary effort is normal.     Breath sounds: Normal breath sounds. No wheezing, rhonchi or rales.  Musculoskeletal:     Cervical back: Neck supple.  Skin:    General: Skin is warm and dry.     Findings: No rash.     Comments: No hives noted today.  Neurological:     Mental Status: She is alert and oriented to person, place, and time.  Psychiatric:        Behavior: Behavior normal.    Previous notes and tests were reviewed. The plan was reviewed with the patient/family, and all questions/concerned were addressed.  It was my pleasure to see Theresa Rivera today and participate in her care. Please feel free to contact me with any questions or concerns.  Sincerely,  Wyline Mood, DO Allergy & Immunology  [facility] office: [phone] [facility] office: [phone]

## 2022-10-12 ENCOUNTER — Ambulatory Visit: Payer: 59 | Admitting: Allergy

## 2022-10-13 ENCOUNTER — Encounter: Payer: Self-pay | Admitting: Allergy

## 2022-10-19 ENCOUNTER — Other Ambulatory Visit: Payer: Self-pay | Admitting: Allergy

## 2022-10-19 ENCOUNTER — Ambulatory Visit (INDEPENDENT_AMBULATORY_CARE_PROVIDER_SITE_OTHER): Payer: 59

## 2022-10-19 ENCOUNTER — Other Ambulatory Visit (HOSPITAL_BASED_OUTPATIENT_CLINIC_OR_DEPARTMENT_OTHER): Payer: Self-pay

## 2022-10-19 DIAGNOSIS — L501 Idiopathic urticaria: Secondary | ICD-10-CM | POA: Diagnosis not present

## 2022-10-19 MED ORDER — FAMOTIDINE 20 MG PO TABS
20.0000 mg | ORAL_TABLET | Freq: Two times a day (BID) | ORAL | 2 refills | Status: DC
  Filled 2022-10-19: qty 180, 90d supply, fill #0

## 2022-10-20 ENCOUNTER — Other Ambulatory Visit: Payer: Self-pay

## 2022-10-20 ENCOUNTER — Other Ambulatory Visit (HOSPITAL_BASED_OUTPATIENT_CLINIC_OR_DEPARTMENT_OTHER): Payer: Self-pay

## 2022-10-26 ENCOUNTER — Ambulatory Visit: Payer: 59

## 2022-11-09 ENCOUNTER — Encounter: Payer: Self-pay | Admitting: Allergy

## 2022-11-09 ENCOUNTER — Ambulatory Visit (INDEPENDENT_AMBULATORY_CARE_PROVIDER_SITE_OTHER): Payer: 59

## 2022-11-09 DIAGNOSIS — L501 Idiopathic urticaria: Secondary | ICD-10-CM

## 2022-11-10 ENCOUNTER — Encounter: Payer: Self-pay | Admitting: Family Medicine

## 2022-11-11 NOTE — Telephone Encounter (Signed)
See note

## 2022-11-11 NOTE — Telephone Encounter (Signed)
I am not sure if there is much else I can offer. Has she discussed with her allergist?  Theresa Rivera. Jimmey Ralph, MD 11/11/2022 11:12 AM

## 2022-11-19 ENCOUNTER — Other Ambulatory Visit (HOSPITAL_BASED_OUTPATIENT_CLINIC_OR_DEPARTMENT_OTHER): Payer: Self-pay

## 2022-11-20 ENCOUNTER — Other Ambulatory Visit: Payer: Self-pay | Admitting: *Deleted

## 2022-11-20 ENCOUNTER — Other Ambulatory Visit (HOSPITAL_BASED_OUTPATIENT_CLINIC_OR_DEPARTMENT_OTHER): Payer: Self-pay

## 2022-11-20 ENCOUNTER — Other Ambulatory Visit: Payer: Self-pay

## 2022-11-20 MED ORDER — MONTELUKAST SODIUM 10 MG PO TABS
10.0000 mg | ORAL_TABLET | Freq: Every day | ORAL | 1 refills | Status: DC
  Filled 2022-11-20: qty 90, 90d supply, fill #0
  Filled 2022-11-21: qty 30, 30d supply, fill #0

## 2022-11-20 MED ORDER — FAMOTIDINE 20 MG PO TABS
20.0000 mg | ORAL_TABLET | Freq: Two times a day (BID) | ORAL | 1 refills | Status: DC
  Filled 2022-11-20: qty 180, 90d supply, fill #0
  Filled 2022-11-21: qty 60, 30d supply, fill #0

## 2022-11-20 MED ORDER — ALLEGRA ALLERGY 180 MG PO TABS: 360.0000 mg | ORAL_TABLET | Freq: Two times a day (BID) | ORAL | 1 refills | Status: DC

## 2022-11-20 MED ORDER — FAMOTIDINE 20 MG PO TABS: 20.0000 mg | ORAL_TABLET | Freq: Two times a day (BID) | ORAL | 1 refills | Status: DC

## 2022-11-20 MED ORDER — MONTELUKAST SODIUM 10 MG PO TABS: 10.0000 mg | ORAL_TABLET | Freq: Every day | ORAL | 1 refills | Status: DC

## 2022-11-20 MED ORDER — ALLEGRA ALLERGY 180 MG PO TABS
360.0000 mg | ORAL_TABLET | Freq: Two times a day (BID) | ORAL | 1 refills | Status: DC
  Filled 2022-11-20: qty 180, 45d supply, fill #0

## 2022-11-21 ENCOUNTER — Other Ambulatory Visit (HOSPITAL_BASED_OUTPATIENT_CLINIC_OR_DEPARTMENT_OTHER): Payer: Self-pay

## 2022-11-24 ENCOUNTER — Other Ambulatory Visit (HOSPITAL_BASED_OUTPATIENT_CLINIC_OR_DEPARTMENT_OTHER): Payer: Self-pay

## 2022-11-25 ENCOUNTER — Telehealth: Payer: Self-pay

## 2022-11-25 ENCOUNTER — Other Ambulatory Visit (HOSPITAL_COMMUNITY): Payer: Self-pay

## 2022-11-25 ENCOUNTER — Other Ambulatory Visit (HOSPITAL_BASED_OUTPATIENT_CLINIC_OR_DEPARTMENT_OTHER): Payer: Self-pay

## 2022-11-25 NOTE — Telephone Encounter (Signed)
Patient Advocate Encounter   Received notification from [employer] that prior authorization is required for Allegra Allergy 180MG  tablets   Submitted: n/a [place] [id number]  PA not submitted at this time as OTC medications are not covered under plan.

## 2022-11-25 NOTE — Telephone Encounter (Signed)
Patient called back and has been informed to buy Allegra over the counter. Patient verbalized understanding.

## 2022-11-25 NOTE — Telephone Encounter (Signed)
Called and left a voicemail asking for patient to return call to inform that they will need to purchase the Allegra over the counter.

## 2022-11-30 ENCOUNTER — Ambulatory Visit: Payer: 59

## 2022-12-02 ENCOUNTER — Ambulatory Visit (INDEPENDENT_AMBULATORY_CARE_PROVIDER_SITE_OTHER): Payer: 59

## 2022-12-02 DIAGNOSIS — L501 Idiopathic urticaria: Secondary | ICD-10-CM

## 2022-12-02 MED ORDER — OMALIZUMAB 150 MG/ML ~~LOC~~ SOSY
300.0000 mg | PREFILLED_SYRINGE | SUBCUTANEOUS | Status: DC
  Administered 2022-12-02 – 2023-03-08 (×5): 300 mg via SUBCUTANEOUS

## 2022-12-03 ENCOUNTER — Ambulatory Visit: Payer: 59

## 2022-12-23 ENCOUNTER — Ambulatory Visit: Payer: 59 | Admitting: *Deleted

## 2022-12-23 DIAGNOSIS — L501 Idiopathic urticaria: Secondary | ICD-10-CM

## 2022-12-28 ENCOUNTER — Other Ambulatory Visit: Payer: Self-pay | Admitting: *Deleted

## 2022-12-28 MED ORDER — OMALIZUMAB 150 MG/ML ~~LOC~~ SOSY: 300.0000 mg | PREFILLED_SYRINGE | SUBCUTANEOUS | 11 refills | Status: DC

## 2023-01-13 ENCOUNTER — Ambulatory Visit (INDEPENDENT_AMBULATORY_CARE_PROVIDER_SITE_OTHER): Payer: 59 | Admitting: *Deleted

## 2023-01-13 ENCOUNTER — Ambulatory Visit: Payer: 59

## 2023-01-13 DIAGNOSIS — L501 Idiopathic urticaria: Secondary | ICD-10-CM

## 2023-01-18 ENCOUNTER — Ambulatory Visit: Payer: 59

## 2023-01-27 NOTE — Progress Notes (Unsigned)
Follow Up Note  RE: Theresa Rivera MRN: [id number] DOB: [birth year] Date of Office Visit: 01/28/2023  Referring provider: Ardith Dark, MD Primary care provider: Ardith Dark, MD  Chief Complaint: No chief complaint on file.  History of Present Illness: I had the pleasure of seeing Theresa Rivera for a follow up visit at the [facility] on 01/27/2023. Theresa Rivera is a 62 y.o. female, who is being followed for urticaria on Xolair. Her previous allergy office visit was on 09/29/2022 with Dr. Selena Batten. Today is a regular follow up visit.  Urticaria Past history - Pruritic rash since September 2023 which only resolves with prednisone. Theresa Rivera had Covid-19 and received 3 vaccines (RSV, Flu, Tdap) on the same day and 2 days later developed this rash. No issues previously with flu and tdap. 04/16/2022: CBC, CMP, TSH, CU normal, skin biopsy showed urticaria? 2023 Crp, Esr, ANA, tryptase, C3, C4, alpha gal, CBC diff normal, negative food/environmental panel.   Interim history - started Xolair 300mg  every 4 weeks in Jan 2024. Doing better but noticing few hives 4-5 days before due for injections.  Move Xolair 300mg  injections to every 3 weeks.  Stop Singulair. Continue allegra and famotidine.  If no hives/itching for 2 weeks then: Decrease Pepcid to 20mg  once a day. Continue with allegra 2 tablets twice a day. If no symptoms for 2 weeks then: Decrease allegra to 1 tablet twice a day. Continue Pepcid 20mg  once a day. If no symptoms for 2 weeks then: Stop Pepcid. Continue allegra 180mg  once a day. If no symptoms for 2 weeks then: Stop allegra. If you get symptoms then go back to the dose where you didn't have any symptoms.  Avoid the following potential triggers: alcohol, tight clothing, NSAIDs, hot showers and getting overheated. Recommend in the future only getting 1 vaccine at a time.    Return in about 4 months (around 01/29/2023).  Assessment and Plan: [place]um is a 62 y.o.  female with: 1. Idiopathic urticaria      No follow-ups on file.  No orders of the defined types were placed in this encounter.  Lab Orders  No laboratory test(s) ordered today    Diagnostics: Spirometry:  Tracings reviewed. Her effort: {Blank single:19197::"Good reproducible efforts.","It was hard to get consistent efforts and there is a question as to whether this reflects a maximal maneuver.","Poor effort, data can not be interpreted."} FVC: ***L FEV1: ***L, ***% predicted FEV1/FVC ratio: ***% Interpretation: {Blank single:19197::"Spirometry consistent with mild obstructive disease","Spirometry consistent with moderate obstructive disease","Spirometry consistent with severe obstructive disease","Spirometry consistent with possible restrictive disease","Spirometry consistent with mixed obstructive and restrictive disease","Spirometry uninterpretable due to technique","Spirometry consistent with normal pattern","No overt abnormalities noted given today's efforts"}.  Please see scanned spirometry results for details.  Skin Testing: {Blank single:19197::"Select foods","Environmental allergy panel","Environmental allergy panel and select foods","Food allergy panel","None","Deferred due to recent antihistamines use"}. *** Results discussed with patient/family.   Medication List:  Current Outpatient Medications  Medication Sig Dispense Refill   acyclovir (ZOVIRAX) 200 MG capsule Take 4 capsules (800 mg total) by mouth 2 (two) times daily. Take for 5 days as needed for flares 90 capsule 1   ALLEGRA ALLERGY 180 MG tablet Take 2 tablets (360 mg total) by mouth in the morning and at bedtime. 180 tablet 1   EPINEPHrine 0.3 mg/0.3 mL IJ SOAJ injection Inject 0.3 mg into the muscle as needed for anaphylaxis. 2 each 1   famotidine (PEPCID) 20 MG tablet Take 1 tablet (20  mg total) by mouth 2 (two) times daily. 180 tablet 1   montelukast (SINGULAIR) 10 MG tablet Take 1 tablet (10 mg total) by  mouth at bedtime. 90 tablet 1   omalizumab (XOLAIR) 150 MG/ML prefilled syringe Inject 300 mg into the skin every 14 (fourteen) days. 4 mL 11   Current Facility-Administered Medications  Medication Dose Route Frequency Provider Last Rate Last Admin   omalizumab Geoffry Paradise) prefilled syringe 300 mg  300 mg Subcutaneous Q21 days Ellamae Sia, DO   300 mg at 01/13/23 1002   Allergies: No Known Allergies I reviewed her past medical history, social history, family history, and environmental history and no significant changes have been reported from her previous visit.  Review of Systems  Constitutional:  Negative for appetite change, chills, fever and unexpected weight change.  HENT:  Negative for congestion and rhinorrhea.   Eyes:  Negative for itching.  Respiratory:  Negative for cough, chest tightness, shortness of breath and wheezing.   Cardiovascular:  Negative for chest pain.  Gastrointestinal:  Negative for abdominal pain.  Genitourinary:  Negative for difficulty urinating.  Skin:  Positive for rash.  Neurological:  Negative for headaches.    Objective: There were no vitals taken for this visit. There is no height or weight on file to calculate BMI. Physical Exam Vitals and nursing note reviewed.  Constitutional:      Appearance: Normal appearance. Theresa Rivera is well-developed.  HENT:     Head: Normocephalic and atraumatic.     Right Ear: Tympanic membrane and external ear normal.     Left Ear: Tympanic membrane and external ear normal.     Nose: Nose normal.     Mouth/Throat:     Mouth: Mucous membranes are moist.     Pharynx: Oropharynx is clear.  Eyes:     Conjunctiva/sclera: Conjunctivae normal.  Cardiovascular:     Rate and Rhythm: Normal rate and regular rhythm.     Heart sounds: Normal heart sounds. No murmur heard.    No friction rub. No gallop.  Pulmonary:     Effort: Pulmonary effort is normal.     Breath sounds: Normal breath sounds. No wheezing, rhonchi or rales.   Musculoskeletal:     Cervical back: Neck supple.  Skin:    General: Skin is warm and dry.     Findings: No rash.     Comments: No hives noted today.  Neurological:     Mental Status: Theresa Rivera is alert and oriented to person, place, and time.  Psychiatric:        Behavior: Behavior normal.    Previous notes and tests were reviewed. The plan was reviewed with the patient/family, and all questions/concerned were addressed.  It was my pleasure to see Theresa Rivera today and participate in her care. Please feel free to contact me with any questions or concerns.  Sincerely,  Wyline Mood, DO Allergy & Immunology  [facility] office: [phone] [facility] office: [phone]

## 2023-01-28 ENCOUNTER — Other Ambulatory Visit: Payer: Self-pay

## 2023-01-28 ENCOUNTER — Ambulatory Visit (INDEPENDENT_AMBULATORY_CARE_PROVIDER_SITE_OTHER): Payer: 59 | Admitting: Allergy

## 2023-01-28 ENCOUNTER — Encounter: Payer: Self-pay | Admitting: Allergy

## 2023-01-28 VITALS — BP 112/86 | HR 58 | Temp 97.9°F | Resp 16

## 2023-01-28 DIAGNOSIS — L501 Idiopathic urticaria: Secondary | ICD-10-CM | POA: Diagnosis not present

## 2023-01-28 DIAGNOSIS — R21 Rash and other nonspecific skin eruption: Secondary | ICD-10-CM | POA: Diagnosis not present

## 2023-01-28 MED ORDER — MOMETASONE FUROATE 0.1 % EX OINT
TOPICAL_OINTMENT | Freq: Every day | CUTANEOUS | 1 refills | Status: DC | PRN
Start: 2023-01-28 — End: 2023-04-22

## 2023-01-28 MED ORDER — MONTELUKAST SODIUM 10 MG PO TABS: 10.0000 mg | ORAL_TABLET | Freq: Every day | ORAL | 1 refills | Status: DC

## 2023-01-28 MED ORDER — FAMOTIDINE 20 MG PO TABS: 20.0000 mg | ORAL_TABLET | Freq: Two times a day (BID) | ORAL | 1 refills | Status: DC

## 2023-01-28 NOTE — Patient Instructions (Addendum)
Move Xolair 300mg  injections to every 4 weeks. Use benadryl cream for itchy skin. Use mometasone 0.1% cream once a day as needed for rash flares. Do not use on the face, neck, armpits or groin area. Do not use more than 1 week in a row.   If you are feeling better:  Stop Singulair. Continue allegra and famotidine.  If no hives/itching for 2 weeks then: Decrease Pepcid to 20mg  once a day. Continue with allegra 2 tablets twice a day. If no symptoms for 2 weeks then: Decrease allegra to 1 tablet twice a day. Continue Pepcid 20mg  once a day. If no symptoms for 2 weeks then: Stop Pepcid. Continue allegra 180mg  once a day. If no symptoms for 2 weeks then: Stop allegra. If you get symptoms then go back to the dose where you didn't have any symptoms.   Avoid the following potential triggers: alcohol, tight clothing, NSAIDs, hot showers and getting overheated. Recommend in the future only getting 1 vaccine at a time.   Follow up in 4 month or sooner if needed.

## 2023-02-04 ENCOUNTER — Telehealth: Payer: Self-pay | Admitting: Allergy

## 2023-02-04 ENCOUNTER — Ambulatory Visit (INDEPENDENT_AMBULATORY_CARE_PROVIDER_SITE_OTHER): Payer: 59 | Admitting: *Deleted

## 2023-02-04 DIAGNOSIS — L501 Idiopathic urticaria: Secondary | ICD-10-CM | POA: Diagnosis not present

## 2023-02-04 NOTE — Telephone Encounter (Signed)
Patient came in today requesting to pay her copay. Patient asked me to pay with [employer] I advised her that she would have to have the physical card with her. Patient requested a printout of her bills.

## 2023-02-06 IMAGING — MG MM DIGITAL SCREENING BILAT W/ TOMO AND CAD
8 series · 9 of 24 positions shown · non-contrast
Comparison: Previous exam(s).

CLINICAL DATA: Screening.

EXAM:
DIGITAL SCREENING BILATERAL MAMMOGRAM WITH TOMOSYNTHESIS AND CAD
TECHNIQUE: Bilateral screening digital craniocaudal and mediolateral oblique
mammograms were obtained. Bilateral screening digital breast
tomosynthesis was performed. The images were evaluated with
computer-aided detection.

[L MLO synth-2D]
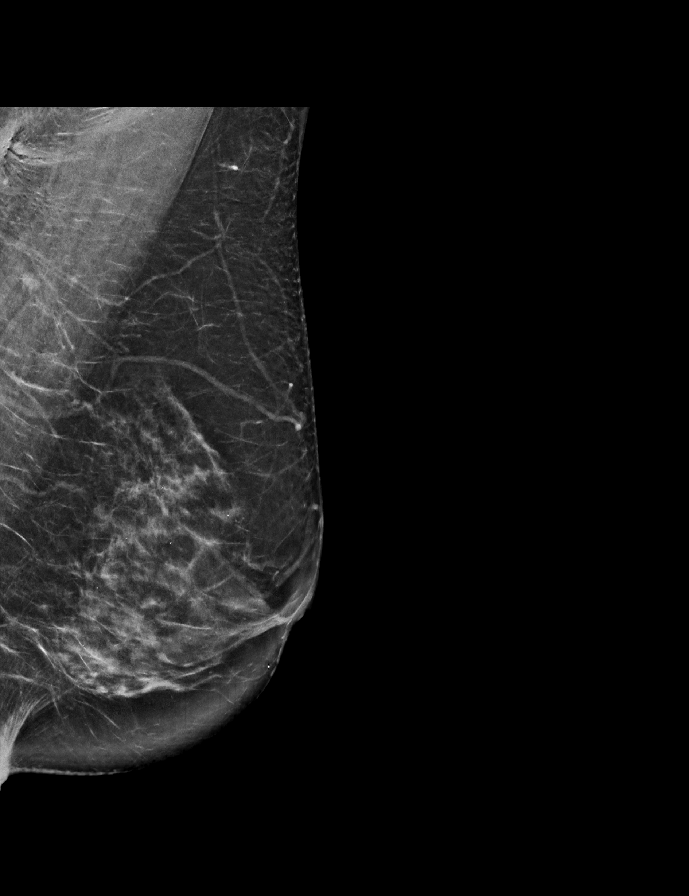

[L CC synth-2D]
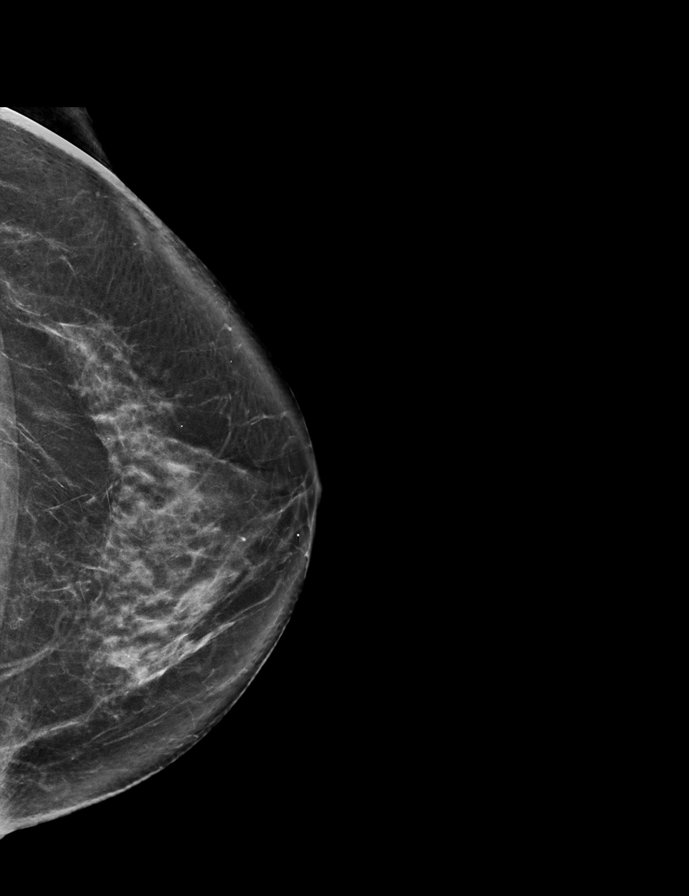

[R CC synth-2D]
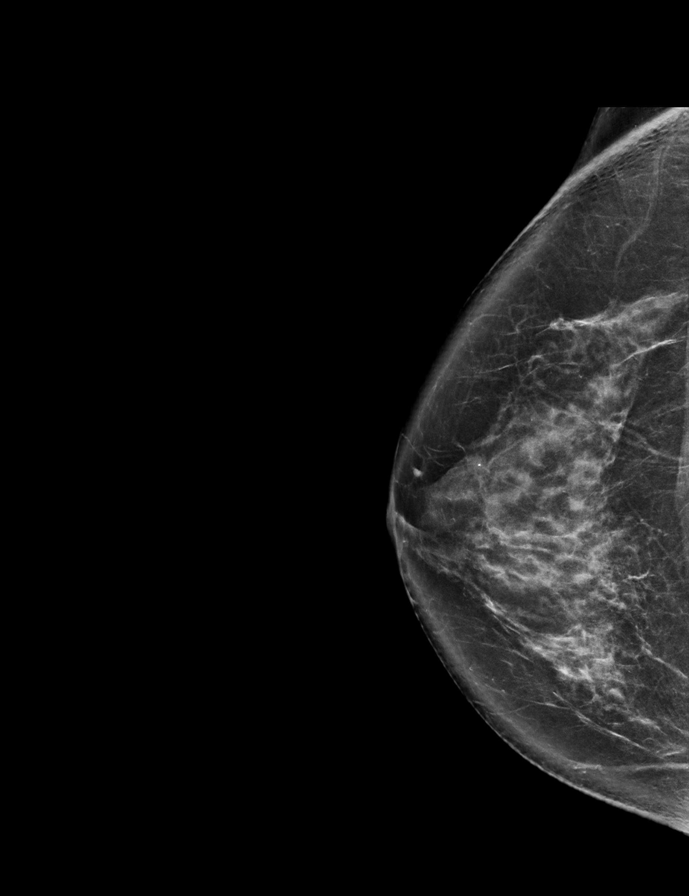

[R MLO synth-2D]
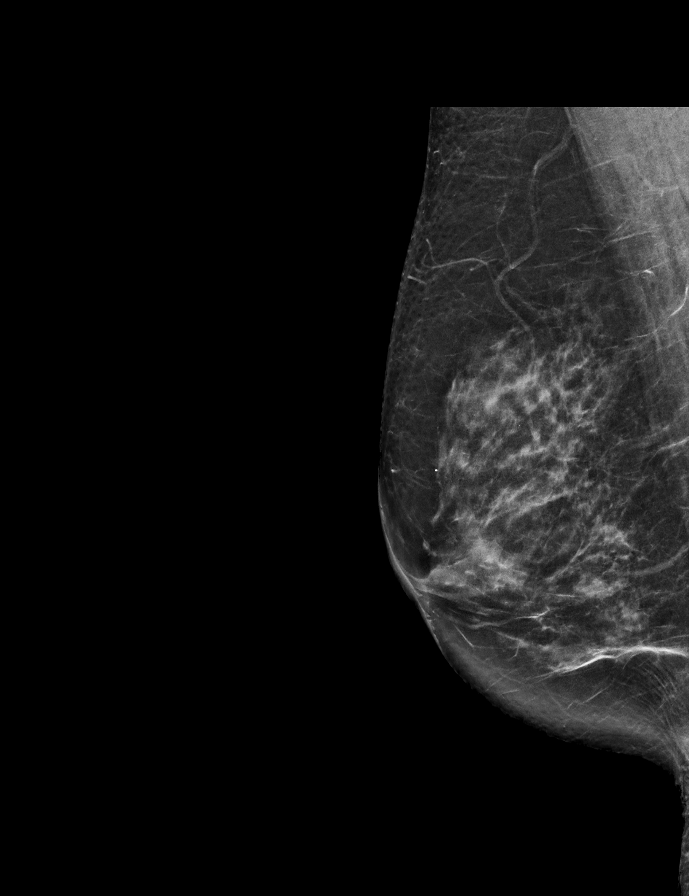

[L MLO tomo · 2 of 74 frames shown]
[frame 24/74]
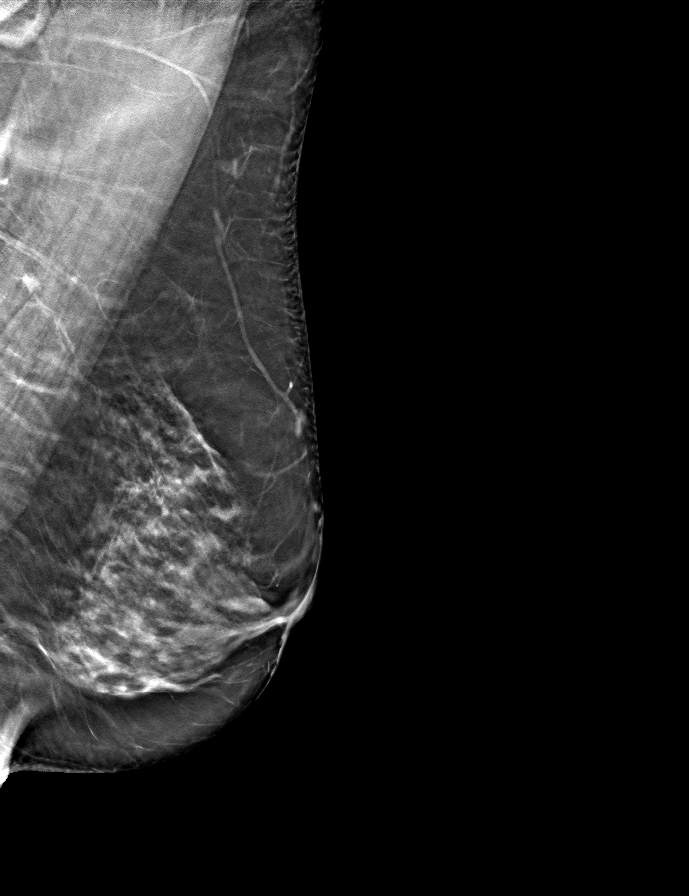
[frame 37/74]
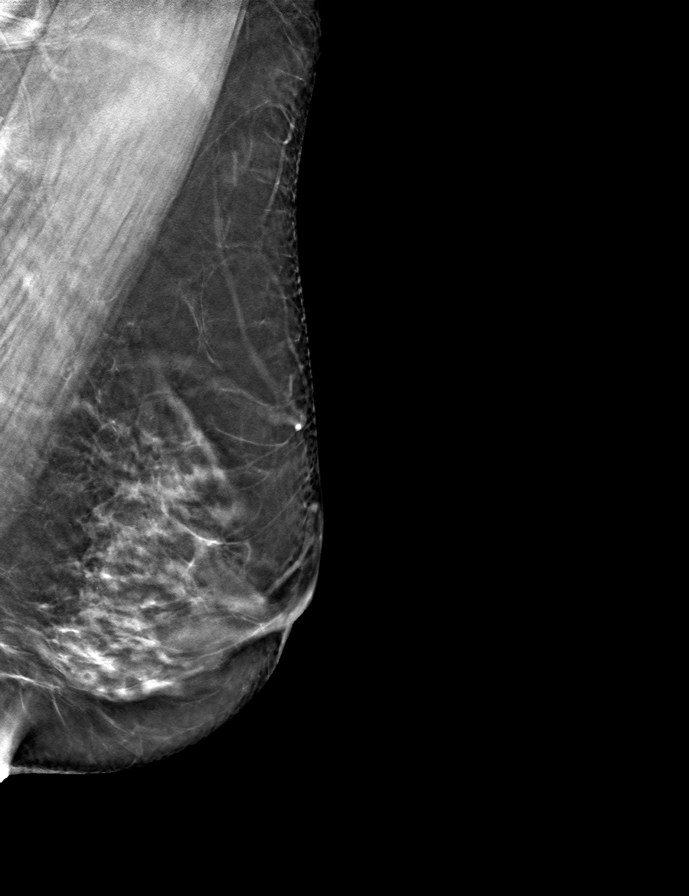

[L CC tomo · tomo slice 37/74.0]
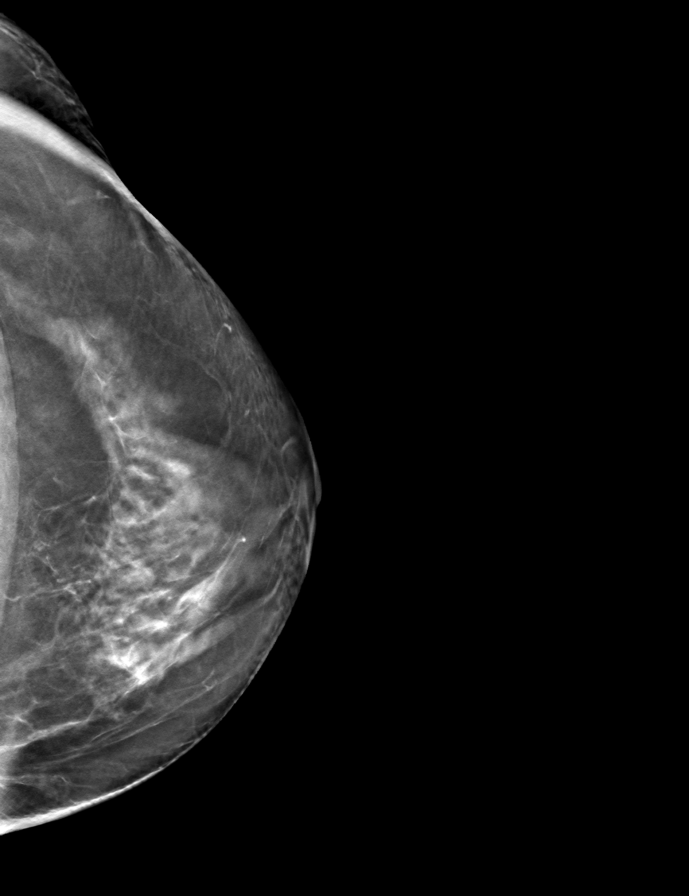

[R MLO tomo · tomo slice 35/70.0]
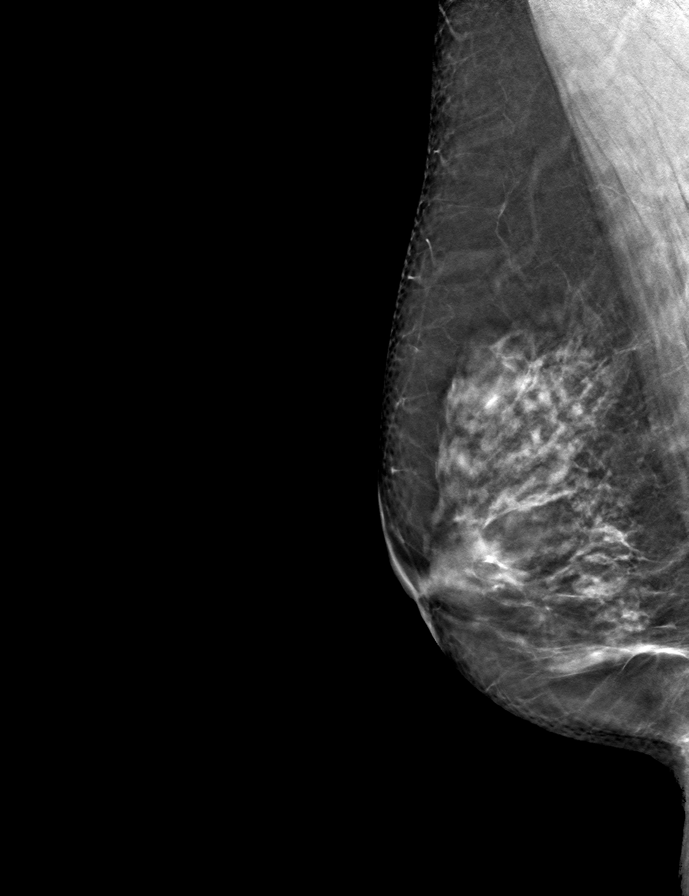

[R CC tomo · tomo slice 39/78.0]
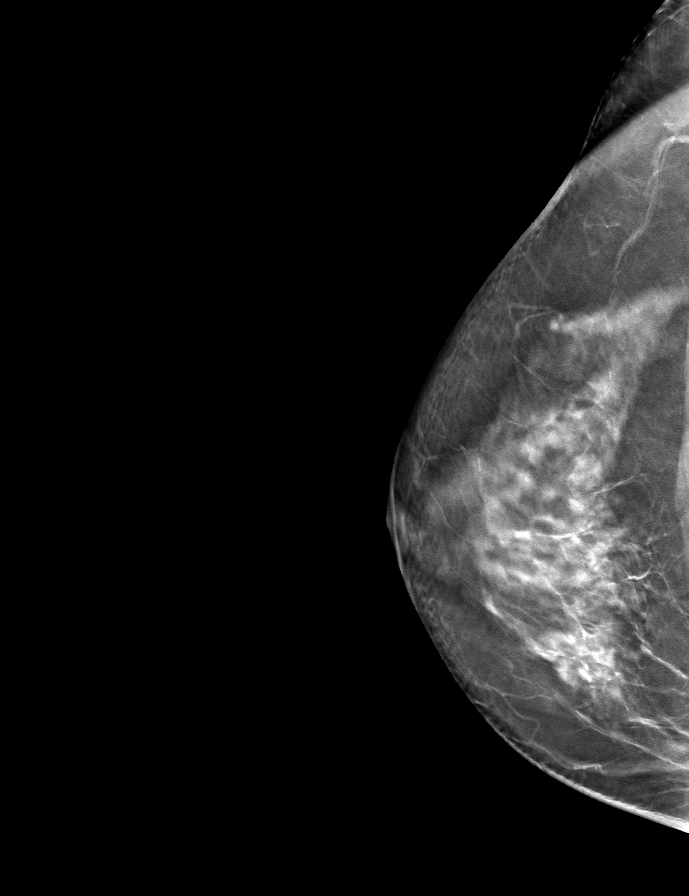

[9 of 24 positions shown; findings below may reference images not displayed]

ACR Breast Density Category c: The breast tissue is heterogeneously
dense, which may obscure small masses.
FINDINGS: There are no findings suspicious for malignancy.
IMPRESSION: No mammographic evidence of malignancy. A result letter of this
screening mammogram will be mailed directly to the patient.

RECOMMENDATION:
Screening mammogram in one year. (Code:Q3-W-BC3)

BI-RADS CATEGORY  1: Negative.

## 2023-03-04 ENCOUNTER — Ambulatory Visit: Payer: 59

## 2023-03-08 ENCOUNTER — Ambulatory Visit (INDEPENDENT_AMBULATORY_CARE_PROVIDER_SITE_OTHER): Payer: 59 | Admitting: *Deleted

## 2023-03-08 DIAGNOSIS — L501 Idiopathic urticaria: Secondary | ICD-10-CM

## 2023-03-12 ENCOUNTER — Other Ambulatory Visit (HOSPITAL_BASED_OUTPATIENT_CLINIC_OR_DEPARTMENT_OTHER): Payer: Self-pay

## 2023-03-12 MED ORDER — COVID-19 MRNA VAC-TRIS(PFIZER) 30 MCG/0.3ML IM SUSY
0.3000 mL | PREFILLED_SYRINGE | Freq: Once | INTRAMUSCULAR | 0 refills | Status: AC
  Filled 2023-03-12: qty 0.3, 1d supply, fill #0

## 2023-03-15 ENCOUNTER — Other Ambulatory Visit (HOSPITAL_BASED_OUTPATIENT_CLINIC_OR_DEPARTMENT_OTHER): Payer: Self-pay

## 2023-03-22 ENCOUNTER — Other Ambulatory Visit (HOSPITAL_BASED_OUTPATIENT_CLINIC_OR_DEPARTMENT_OTHER): Payer: Self-pay

## 2023-03-22 MED ORDER — INFLUENZA VIRUS VACC SPLIT PF (FLUZONE) 0.5 ML IM SUSY
0.5000 mL | PREFILLED_SYRINGE | Freq: Once | INTRAMUSCULAR | 0 refills | Status: AC
  Filled 2023-03-22: qty 0.5, 1d supply, fill #0

## 2023-03-26 ENCOUNTER — Other Ambulatory Visit: Payer: Self-pay | Admitting: *Deleted

## 2023-03-26 MED ORDER — OMALIZUMAB 150 MG/ML ~~LOC~~ SOSY: 300.0000 mg | PREFILLED_SYRINGE | SUBCUTANEOUS | 11 refills | Status: DC

## 2023-04-05 ENCOUNTER — Ambulatory Visit: Payer: 59

## 2023-04-16 ENCOUNTER — Other Ambulatory Visit: Payer: Self-pay | Admitting: Family Medicine

## 2023-04-16 DIAGNOSIS — Z1231 Encounter for screening mammogram for malignant neoplasm of breast: Secondary | ICD-10-CM

## 2023-04-19 ENCOUNTER — Ambulatory Visit: Payer: 59

## 2023-04-22 ENCOUNTER — Encounter: Payer: Self-pay | Admitting: Family Medicine

## 2023-04-22 ENCOUNTER — Other Ambulatory Visit: Payer: Self-pay

## 2023-04-22 ENCOUNTER — Ambulatory Visit (INDEPENDENT_AMBULATORY_CARE_PROVIDER_SITE_OTHER): Payer: 59 | Admitting: Family Medicine

## 2023-04-22 VITALS — BP 120/80 | HR 82 | Temp 98.0°F | Ht 66.0 in | Wt 168.2 lb

## 2023-04-22 DIAGNOSIS — E785 Hyperlipidemia, unspecified: Secondary | ICD-10-CM

## 2023-04-22 DIAGNOSIS — Z1159 Encounter for screening for other viral diseases: Secondary | ICD-10-CM

## 2023-04-22 DIAGNOSIS — R739 Hyperglycemia, unspecified: Secondary | ICD-10-CM

## 2023-04-22 DIAGNOSIS — L509 Urticaria, unspecified: Secondary | ICD-10-CM | POA: Diagnosis not present

## 2023-04-22 DIAGNOSIS — Z0001 Encounter for general adult medical examination with abnormal findings: Secondary | ICD-10-CM | POA: Diagnosis not present

## 2023-04-22 DIAGNOSIS — Z114 Encounter for screening for human immunodeficiency virus [HIV]: Secondary | ICD-10-CM

## 2023-04-22 NOTE — Addendum Note (Signed)
Addended by: Lorn Junes on: 04/22/2023 04:50 PM   Modules accepted: Orders

## 2023-04-22 NOTE — Addendum Note (Signed)
Addended by: Lorn Junes on: 04/22/2023 03:56 PM   Modules accepted: Orders

## 2023-04-22 NOTE — Assessment & Plan Note (Signed)
Resolved.  She will follow-up with allergy if she has any recurrence.

## 2023-04-22 NOTE — Progress Notes (Signed)
Chief Complaint:  Theresa Rivera is a 62 y.o. female who presents today for her annual comprehensive physical exam.    Assessment/Plan:  Chronic Problems Addressed Today: Hyperglycemia Check A1c.  Discussed lifestyle modifications.  Dyslipidemia Check lipids.  Discussed lifestyle modifications.  Urticaria Resolved.  She will follow-up with allergy if she has any recurrence.  Preventative Healthcare: She will come back for labs.  Up-to-date on vaccines and screenings.  No other needs Pap smear due to age.  Patient Counseling(The following topics were reviewed and/or handout was given):  -Nutrition: Stressed importance of moderation in sodium/caffeine intake, saturated fat and cholesterol, caloric balance, sufficient intake of fresh fruits, vegetables, and fiber.  -Stressed the importance of regular exercise.   -Substance Abuse: Discussed cessation/primary prevention of tobacco, alcohol, or other drug use; driving or other dangerous activities under the influence; availability of treatment for abuse.   -Injury prevention: Discussed safety belts, safety helmets, smoke detector, smoking near bedding or upholstery.   -Sexuality: Discussed sexually transmitted diseases, partner selection, use of condoms, avoidance of unintended pregnancy and contraceptive alternatives.   -Dental health: Discussed importance of regular tooth brushing, flossing, and dental visits.  -Health maintenance and immunizations reviewed. Please refer to Health maintenance section.  Return to care in 1 year for next preventative visit.     Subjective:  HPI:  She has no acute complaints today. Since our last visit she has been following with allergy for idiopathic urticaria.  This has since spontaneously resolved.  Overall feels well today.   Lifestyle Diet: Balanced. Plenty of fruits.  Exercise: Going to gym. Getting back into cardio and      04/22/2023    1:16 PM  Depression screen PHQ 2/9   Decreased Interest 0  Down, Depressed, Hopeless 0  PHQ - 2 Score 0    Health Maintenance Due  Topic Date Due   HIV Screening  Never done   Hepatitis C Screening  Never done     ROS: Per HPI, otherwise a complete review of systems was negative.   PMH:  The following were reviewed and entered/updated in epic: History reviewed. No pertinent past medical history. 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 No pertinent surgical history.  Family History  Problem Relation Age of Onset   Prostate cancer Father    Breast cancer Maternal Grandmother 48   Eczema Son     Medications- reviewed and updated Current Outpatient Medications  Medication Sig Dispense Refill   acyclovir (ZOVIRAX) 200 MG capsule Take 4 capsules (800 mg total) by mouth 2 (two) times daily. Take for 5 days as needed for flares 90 capsule 1   Current Facility-Administered Medications  Medication Dose Route Frequency Provider Last Rate Last Admin   omalizumab Geoffry Paradise) prefilled syringe 300 mg  300 mg Subcutaneous Q21 days Ellamae Sia, DO   300 mg at 03/08/23 1125    Allergies-reviewed and updated No Known Allergies  Social History   Socioeconomic History   Marital status: Married    Spouse name: Not on file   Number of children: Not on file   Years of education: Not on file   Highest education level: Not on file  Occupational History   Not on file  Tobacco Use   Smoking status: Never   Smokeless tobacco: Never  Substance and Sexual  Activity   Alcohol use: Yes    Alcohol/week: 7.0 standard drinks of alcohol    Types: 7 Glasses of wine per week   Drug use: Never   Sexual activity: Yes  Other Topics Concern   Not on file  Social History Narrative   Not on file   Social Determinants of Health   Financial Resource Strain: Not on  file  Food Insecurity: Not on file  Transportation Needs: Not on file  Physical Activity: Not on file  Stress: Not on file  Social Connections: Not on file        Objective:  Physical Exam: BP 120/80   Pulse 82   Temp 98 F (36.7 C) (Temporal)   Ht 5\' 6"  (1.676 m)   Wt 168 lb 3.2 oz (76.3 kg)   SpO2 98%   BMI 27.15 kg/m   Body mass index is 27.15 kg/m. Wt Readings from Last 3 Encounters:  04/22/23 168 lb 3.2 oz (76.3 kg)  09/29/22 171 lb 4 oz (77.7 kg)  05/19/22 164 lb (74.4 kg)   Gen: NAD, resting comfortably HEENT: TMs normal bilaterally. OP clear. No thyromegaly noted.  CV: RRR with no murmurs appreciated Pulm: NWOB, CTAB with no crackles, wheezes, or rhonchi GI: Normal bowel sounds present. Soft, Nontender, Nondistended. MSK: no edema, cyanosis, or clubbing noted Skin: warm, dry Neuro: CN2-12 grossly intact. Strength 5/5 in upper and lower extremities. Reflexes symmetric and intact bilaterally.  Psych: Normal affect and thought content     Tyheim Vanalstyne M. Jimmey Ralph, MD 04/22/2023 2:04 PM

## 2023-04-22 NOTE — Addendum Note (Signed)
Addended by: Dyann Kief on: 04/22/2023 03:48 PM   Modules accepted: Orders

## 2023-04-22 NOTE — Patient Instructions (Signed)
It was very nice to see you today!  Please come back for labs.  Please continue to work on diet and exercise.  Will see back in year for your physical.  Come back sooner if needed.  Return in about 1 year (around 04/21/2024) for Annual Physical.   Take care, Dr Jimmey Ralph  PLEASE NOTE:  If you had any lab tests, please let us know if you have not heard back within a few days. You may see your results on mychart before we have a chance to review them but we will give you a call once they are reviewed by [place].   If we ordered any referrals today, please let us know if you have not heard from their office within the next week.   If you had any urgent prescriptions sent in today, please check with the pharmacy within an hour of our visit to make sure the prescription was transmitted appropriately.   Please try these tips to maintain a healthy lifestyle:  Eat at least 3 REAL meals and 1-2 snacks per day.  Aim for no more than 5 hours between eating.  If you eat breakfast, please do so within one hour of getting up.   Each meal should contain half fruits/vegetables, one quarter protein, and one quarter carbs (no bigger than a computer mouse)  Cut down on sweet beverages. This includes juice, soda, and sweet tea.   Drink at least 1 glass of water with each meal and aim for at least 8 glasses per day  Exercise at least 150 minutes every week.    Preventive Care 30-39 Years Old, Female Preventive care refers to lifestyle choices and visits with your health care provider that can promote health and wellness. Preventive care visits are also called wellness exams. What can I expect for my preventive care visit? Counseling Your health care provider may ask you questions about your: Medical history, including: Past medical problems. Family medical history. Pregnancy history. Current health, including: Menstrual cycle. Method of birth control. Emotional well-being. Home life and relationship  well-being. Sexual activity and sexual health. Lifestyle, including: Alcohol, nicotine or tobacco, and drug use. Access to firearms. Diet, exercise, and sleep habits. Work and work Astronomer. Sunscreen use. Safety issues such as seatbelt and bike helmet use. Physical exam Your health care provider will check your: Height and weight. These may be used to calculate your BMI (body mass index). BMI is a measurement that tells if you are at a healthy weight. Waist circumference. This measures the distance around your waistline. This measurement also tells if you are at a healthy weight and may help predict your risk of certain diseases, such as type 2 diabetes and high blood pressure. Heart rate and blood pressure. Body temperature. Skin for abnormal spots. What immunizations do I need?  Vaccines are usually given at various ages, according to a schedule. Your health care provider will recommend vaccines for you based on your age, medical history, and lifestyle or other factors, such as travel or where you work. What tests do I need? Screening Your health care provider may recommend screening tests for certain conditions. This may include: Lipid and cholesterol levels. Diabetes screening. This is done by checking your blood sugar (glucose) after you have not eaten for a while (fasting). Pelvic exam and Pap test. Hepatitis B test. Hepatitis C test. HIV (human immunodeficiency virus) test. STI (sexually transmitted infection) testing, if you are at risk. Lung cancer screening. Colorectal cancer screening. Mammogram. Talk with  your health care provider about when you should start having regular mammograms. This may depend on whether you have a family history of breast cancer. BRCA-related cancer screening. This may be done if you have a family history of breast, ovarian, tubal, or peritoneal cancers. Bone density scan. This is done to screen for osteoporosis. Talk with your health care  provider about your test results, treatment options, and if necessary, the need for more tests. Follow these instructions at home: Eating and drinking  Eat a diet that includes fresh fruits and vegetables, whole grains, lean protein, and low-fat dairy products. Take vitamin and mineral supplements as recommended by your health care provider. Do not drink alcohol if: Your health care provider tells you not to drink. You are pregnant, may be pregnant, or are planning to become pregnant. If you drink alcohol: Limit how much you have to 0-1 drink a day. Know how much alcohol is in your drink. In the [place] one drink equals one 12 oz bottle of beer (355 mL), one 5 oz glass of wine (148 mL), or one 1 oz glass of hard liquor (44 mL). Lifestyle Brush your teeth every morning and night with fluoride toothpaste. Floss one time each day. Exercise for at least 30 minutes 5 or more days each week. Do not use any products that contain nicotine or tobacco. These products include cigarettes, chewing tobacco, and vaping devices, such as e-cigarettes. If you need help quitting, ask your health care provider. Do not use drugs. If you are sexually active, practice safe sex. Use a condom or other form of protection to prevent STIs. If you do not wish to become pregnant, use a form of birth control. If you plan to become pregnant, see your health care provider for a prepregnancy visit. Take aspirin only as told by your health care provider. Make sure that you understand how much to take and what form to take. Work with your health care provider to find out whether it is safe and beneficial for you to take aspirin daily. Find healthy ways to manage stress, such as: Meditation, yoga, or listening to music. Journaling. Talking to a trusted person. Spending time with friends and family. Minimize exposure to UV radiation to reduce your risk of skin cancer. Safety Always wear your seat belt while driving or riding in  a vehicle. Do not drive: If you have been drinking alcohol. Do not ride with someone who has been drinking. When you are tired or distracted. While texting. If you have been using any mind-altering substances or drugs. Wear a helmet and other protective equipment during sports activities. If you have firearms in your house, make sure you follow all gun safety procedures. Seek help if you have been physically or sexually abused. What's next? Visit your health care provider once a year for an annual wellness visit. Ask your health care provider how often you should have your eyes and teeth checked. Stay up to date on all vaccines. This information is not intended to replace advice given to you by your health care provider. Make sure you discuss any questions you have with your health care provider. Document Revised: 12/04/2020 Document Reviewed: 12/04/2020 Elsevier Patient Education  2024 [employer].

## 2023-04-22 NOTE — Assessment & Plan Note (Signed)
Check lipids. Discussed lifestyle modifications.  

## 2023-04-22 NOTE — Assessment & Plan Note (Signed)
Check A1c.  Discussed lifestyle modifications. °

## 2023-04-23 ENCOUNTER — Other Ambulatory Visit: Payer: 59

## 2023-04-23 LAB — COMPREHENSIVE METABOLIC PANEL
ALT: 15 U/L (ref 0–35)
AST: 19 U/L (ref 0–37)
Albumin: 4.4 g/dL (ref 3.5–5.2)
Alkaline Phosphatase: 70 U/L (ref 39–117)
BUN: 14 mg/dL (ref 6–23)
CO2: 28 meq/L (ref 19–32)
Calcium: 9.6 mg/dL (ref 8.4–10.5)
Chloride: 101 meq/L (ref 96–112)
Creatinine, Ser: 0.9 mg/dL (ref 0.40–1.20)
GFR: 67.72 mL/min (ref >60.00)
Glucose, Bld: 162 mg/dL — ABNORMAL HIGH (ref 70–99)
Potassium: 3.6 meq/L (ref 3.5–5.1)
Sodium: 140 meq/L (ref 135–145)
Total Bilirubin: 0.6 mg/dL (ref 0.2–1.2)
Total Protein: 7.5 g/dL (ref 6.0–8.3)

## 2023-04-23 LAB — URINALYSIS, ROUTINE W REFLEX MICROSCOPIC
Bilirubin Urine: NEGATIVE
Hgb urine dipstick: NEGATIVE
Ketones, ur: NEGATIVE
Leukocytes,Ua: NEGATIVE
Nitrite: NEGATIVE
RBC / HPF: NONE SEEN (ref 0–?)
Specific Gravity, Urine: 1.025 (ref 1.000–1.030)
Total Protein, Urine: NEGATIVE
Urine Glucose: NEGATIVE
Urobilinogen, UA: 0.2 (ref 0.0–1.0)
pH: 6 (ref 5.0–8.0)

## 2023-04-23 LAB — LIPID PANEL
Cholesterol: 209 mg/dL — ABNORMAL HIGH (ref 0–200)
HDL: 61.2 mg/dL (ref >39.00)
LDL Cholesterol: 130 mg/dL — ABNORMAL HIGH (ref 0–99)
NonHDL: 147.91
Total CHOL/HDL Ratio: 3
Triglycerides: 92 mg/dL (ref 0.0–149.0)
VLDL: 18.4 mg/dL (ref 0.0–40.0)

## 2023-04-23 LAB — TSH: TSH: 1.37 u[IU]/mL (ref 0.35–5.50)

## 2023-04-23 LAB — CBC
HCT: 43.1 % (ref 36.0–46.0)
Hemoglobin: 14.1 g/dL (ref 12.0–15.0)
MCHC: 32.8 g/dL (ref 30.0–36.0)
MCV: 91.8 fL (ref 78.0–100.0)
Platelets: 375 10*3/uL (ref 150.0–400.0)
RBC: 4.69 Mil/uL (ref 3.87–5.11)
RDW: 12.6 % (ref 11.5–15.5)
WBC: 6.9 10*3/uL (ref 4.0–10.5)

## 2023-04-23 LAB — HEPATITIS C ANTIBODY: Hepatitis C Ab: NONREACTIVE

## 2023-04-23 LAB — HIV ANTIBODY (ROUTINE TESTING W REFLEX): HIV 1&2 Ab, 4th Generation: NONREACTIVE

## 2023-04-23 LAB — HEMOGLOBIN A1C: Hgb A1c MFr Bld: 5.4 % (ref 4.6–6.5)

## 2023-04-23 NOTE — Progress Notes (Signed)
Cholesterol is better than last year.  Her A1c is at goal.  The rest for the labs are all stable.  Do not need to make any changes to her treatment plan at this time.  She should continue to work on diet and exercise and we can recheck everything in a year or so.

## 2023-06-03 ENCOUNTER — Ambulatory Visit: Payer: 59 | Admitting: Allergy

## 2023-06-14 ENCOUNTER — Ambulatory Visit
Admission: RE | Admit: 2023-06-14 | Discharge: 2023-06-14 | Disposition: A | Discharge status: Home / Self Care | Payer: 59 | Source: Ambulatory Visit | Attending: Family Medicine | Admitting: Family Medicine

## 2023-06-14 DIAGNOSIS — Z1231 Encounter for screening mammogram for malignant neoplasm of breast: Secondary | ICD-10-CM

## 2023-07-22 ENCOUNTER — Encounter: Payer: Self-pay | Admitting: Family Medicine

## 2023-07-22 ENCOUNTER — Ambulatory Visit (INDEPENDENT_AMBULATORY_CARE_PROVIDER_SITE_OTHER): Payer: Commercial Managed Care - PPO | Admitting: Family Medicine

## 2023-07-22 VITALS — BP 136/87 | HR 75 | Temp 98.2°F | Ht 66.0 in | Wt 166.2 lb

## 2023-07-22 DIAGNOSIS — J101 Influenza due to other identified influenza virus with other respiratory manifestations: Secondary | ICD-10-CM

## 2023-07-22 DIAGNOSIS — R059 Cough, unspecified: Secondary | ICD-10-CM

## 2023-07-22 LAB — POCT INFLUENZA A/B
Influenza A, POC: POSITIVE — AB
Influenza B, POC: NEGATIVE

## 2023-07-22 LAB — POC COVID19 BINAXNOW: SARS Coronavirus 2 Ag: NEGATIVE

## 2023-07-22 NOTE — Patient Instructions (Addendum)
It was very nice to see you today!  You have the flu.  Your body should continue to clear this.  The symptoms should gradually improve over the next several days.  Please make sure that you get plenty of fluids.  You can use over-the-counter meds as needed.  Return if symptoms worsen or fail to improve.   Take care, Dr Jimmey Ralph  PLEASE NOTE:  If you had any lab tests, please let us know if you have not heard back within a few days. You may see your results on mychart before we have a chance to review them but we will give you a call once they are reviewed by [place].   If we ordered any referrals today, please let us know if you have not heard from their office within the next week.   If you had any urgent prescriptions sent in today, please check with the pharmacy within an hour of our visit to make sure the prescription was transmitted appropriately.   Please try these tips to maintain a healthy lifestyle:  Eat at least 3 REAL meals and 1-2 snacks per day.  Aim for no more than 5 hours between eating.  If you eat breakfast, please do so within one hour of getting up.   Each meal should contain half fruits/vegetables, one quarter protein, and one quarter carbs (no bigger than a computer mouse)  Cut down on sweet beverages. This includes juice, soda, and sweet tea.   Drink at least 1 glass of water with each meal and aim for at least 8 glasses per day  Exercise at least 150 minutes every week.

## 2023-07-22 NOTE — Progress Notes (Signed)
   Theresa Rivera is a 63 y.o. female who presents today for an office visit.  Assessment/Plan:  Influenza A Rapid flu positive.  Overall has a reassuring exam without any red flag signs or symptoms.  Symptoms have improved quite a bit over the last day or so.  She is out of the window for Tamiflu and is overall low risk for complications from flu.  We will hold off on starting at this point.  We encouraged hydration.  She can use over-the-counter meds as needed as well.  We discussed reasons to return to care or seek emergent care.   Subjective:  HPI:  See A/P for status of chronic conditions.  Patient is here today with fever, cough, malaise, and headache.  Her symptoms started 4 days ago.  No shortness of breath. No chest pain. No sick contacts.  Symptoms were severe yesterday but have improved significantly this morning.  She has been trying to take plenty of fluids.       Objective:  Physical Exam: Temp 98.2 F (36.8 C) (Temporal)   Ht 5\' 6"  (1.676 m)   Wt 166 lb 3.2 oz (75.4 kg)   BMI 26.83 kg/m   Gen: No acute distress, resting comfortably HEENT: TMs clear. CV: Regular rate and rhythm with no murmurs appreciated Pulm: Normal work of breathing, clear to auscultation bilaterally with no crackles, wheezes, or rhonchi Neuro: Grossly normal, moves all extremities Psych: Normal affect and thought content      Stephene Alegria M. Jimmey Ralph, MD 07/22/2023 8:32 AM

## 2023-07-28 ENCOUNTER — Ambulatory Visit: Payer: Commercial Managed Care - PPO | Admitting: Physician Assistant

## 2023-07-28 ENCOUNTER — Encounter: Payer: Self-pay | Admitting: Physician Assistant

## 2023-07-28 VITALS — BP 136/80 | HR 81 | Temp 97.3°F | Ht 66.0 in | Wt 166.0 lb

## 2023-07-28 DIAGNOSIS — R39198 Other difficulties with micturition: Secondary | ICD-10-CM

## 2023-07-28 DIAGNOSIS — R300 Dysuria: Secondary | ICD-10-CM

## 2023-07-28 LAB — POC URINALSYSI DIPSTICK (AUTOMATED)
Bilirubin, UA: NEGATIVE
Blood, UA: POSITIVE
Glucose, UA: NEGATIVE
Ketones, UA: NEGATIVE
Nitrite, UA: NEGATIVE
Protein, UA: NEGATIVE
Spec Grav, UA: 1.015 (ref 1.010–1.025)
Urobilinogen, UA: 0.2 U/dL
pH, UA: 6 (ref 5.0–8.0)

## 2023-07-28 MED ORDER — NITROFURANTOIN MONOHYD MACRO 100 MG PO CAPS: 100.0000 mg | ORAL_CAPSULE | Freq: Two times a day (BID) | ORAL | 0 refills | Status: AC

## 2023-07-28 NOTE — Progress Notes (Signed)
 Patient ID: Theresa Rivera, female    DOB: 1960-08-04, 63 y.o.   MRN: [id number]   Assessment & Plan:  Burning with urination -     POCT Urinalysis Dipstick (Automated) -     Urine Culture  Difficulty urinating -     POCT Urinalysis Dipstick (Automated) -     Urine Culture  Other orders -     Nitrofurantoin  Monohyd Macro; Take 1 capsule (100 mg total) by mouth 2 (two) times daily for 5 days.  Dispense: 10 capsule; Refill: 0   Assessment and Plan    Urinary Tract Infection New onset burning sensation during urination. Urinalysis suggestive of infection. No fever, chills, severe back pain, or hematuria. No history of kidney stones. -Start Macrobid  twice daily for 5 days. -Consider over-the-counter Azo for symptom relief for the first 2 days. -If yeast infection symptoms develop, patient to notify clinic for Diflucan  prescription. -Urine culture to be performed and antibiotic may be adjusted based on results.  Influenza Recent diagnosis and treatment. No current signs of complications. -Continue supportive care as needed.           No follow-ups on file.    Subjective:    Chief Complaint  Patient presents with   Urinary Tract Infection    Pt in office for possible UTI w/ symptoms of difficulty urinating and burning. Pt states feeling of needing to urinate but when going amount is very little; pt recently had flu and getting over that and just starting to feel better    Urinary Tract Infection      History of Present Illness   The patient, with a recent history of influenza, presents with new onset urinary symptoms suggestive of a urinary tract infection (UTI). She reports a burning sensation that started earlier in the day. She denies any fever, chills, severe back pain, hematuria, or history of kidney stones. She was recently diagnosed with influenza by Dr. Kennyth and chose not to take Tamiflu due to concerns about nausea and her current situation of  moving.   No past medical history on file.  No past surgical history on file.  Family History  Problem Relation Age of Onset   Prostate cancer Father    Breast cancer Maternal Grandmother 2   Eczema Son     Social History   Tobacco Use   Smoking status: Never   Smokeless tobacco: Never  Substance Use Topics   Alcohol use: Yes    Alcohol/week: 7.0 standard drinks of alcohol    Types: 7 Glasses of wine per week   Drug use: Never     No Known Allergies  Review of Systems NEGATIVE UNLESS OTHERWISE INDICATED IN HPI      Objective:     BP 136/80 (BP Location: Left Arm, Patient Position: Sitting, Cuff Size: Normal)   Pulse 81   Temp (!) 97.3 F (36.3 C) (Temporal)   Ht 5' 6 (1.676 m)   Wt 166 lb (75.3 kg)   SpO2 97%   BMI 26.79 kg/m   Wt Readings from Last 3 Encounters:  07/28/23 166 lb (75.3 kg)  07/22/23 166 lb 3.2 oz (75.4 kg)  04/22/23 168 lb 3.2 oz (76.3 kg)    BP Readings from Last 3 Encounters:  07/28/23 136/80  07/22/23 136/87  04/22/23 120/80     Physical Exam Vitals and nursing note reviewed.  Constitutional:      General: She is not in acute distress.    Appearance:  Normal appearance. She is not ill-appearing.  HENT:     Head: Normocephalic and atraumatic.  Cardiovascular:     Rate and Rhythm: Normal rate and regular rhythm.     Pulses: Normal pulses.     Heart sounds: Normal heart sounds.  Pulmonary:     Effort: Pulmonary effort is normal.     Breath sounds: Normal breath sounds.  Abdominal:     General: Abdomen is flat. Bowel sounds are normal.     Palpations: Abdomen is soft.     Tenderness: There is no right CVA tenderness or left CVA tenderness.  Skin:    General: Skin is warm and dry.  Neurological:     General: No focal deficit present.     Mental Status: She is alert.  Psychiatric:        Mood and Affect: Mood normal.       Viviana Trimble M Adaira Centola, PA-C

## 2023-07-29 ENCOUNTER — Telehealth: Payer: Commercial Managed Care - PPO | Admitting: Family Medicine

## 2023-07-30 ENCOUNTER — Encounter: Payer: Self-pay | Admitting: Physician Assistant

## 2023-07-30 LAB — URINE CULTURE
MICRO NUMBER:: 16045112
SPECIMEN QUALITY:: ADEQUATE

## 2023-08-06 ENCOUNTER — Encounter: Payer: Self-pay | Admitting: Family Medicine

## 2023-08-06 ENCOUNTER — Other Ambulatory Visit: Payer: Self-pay | Admitting: *Deleted

## 2023-08-06 MED ORDER — AMOXICILLIN-POT CLAVULANATE 875-125 MG PO TABS: 1.0000 | ORAL_TABLET | Freq: Two times a day (BID) | ORAL | 0 refills | Status: AC

## 2023-08-06 NOTE — Telephone Encounter (Signed)
It is very likely she has developed a sinus infection after flu.  We can send in Augmentin 875-125 twice daily for 10 days.  She should let us know if not improving.

## 2023-10-07 ENCOUNTER — Ambulatory Visit (HOSPITAL_BASED_OUTPATIENT_CLINIC_OR_DEPARTMENT_OTHER): Admitting: Student

## 2023-10-07 ENCOUNTER — Ambulatory Visit (HOSPITAL_BASED_OUTPATIENT_CLINIC_OR_DEPARTMENT_OTHER)

## 2023-10-07 ENCOUNTER — Encounter (HOSPITAL_BASED_OUTPATIENT_CLINIC_OR_DEPARTMENT_OTHER): Payer: Self-pay | Admitting: Student

## 2023-10-07 DIAGNOSIS — M25561 Pain in right knee: Secondary | ICD-10-CM

## 2023-10-07 NOTE — Progress Notes (Signed)
 Chief Complaint: Right knee injury    Discussed the use of AI scribe software for clinical note transcription with the patient, who gave verbal consent to proceed.  History of Present Illness The patient, with no prior history of knee issues, presents with right knee pain that started a couple of weeks ago. The onset of pain coincided with physically maneuvering her late mother, suggesting a possible injury due to strain or awkward movement. The patient describes a popping sensation in the knee, localized pain at the back and side of the knee, and a feeling of instability, as if the knee is going to buckle. The pain and instability have progressively worsened over the past few weeks, impacting the patient's mobility, particularly when navigating stairs.    Surgical History:   None  PMH/PSH/Family History/Social History/Meds/Allergies:   History reviewed. No pertinent past medical history. History reviewed. No pertinent surgical history. Social History   Socioeconomic History   Marital status: Married    Spouse name: Not on file   Number of children: Not on file   Years of education: Not on file   Highest education level: Not on file  Occupational History   Not on file  Tobacco Use   Smoking status: Never   Smokeless tobacco: Never  Substance and Sexual Activity   Alcohol use: Yes    Alcohol/week: 7.0 standard drinks of alcohol    Types: 7 Glasses of wine per week   Drug use: Never   Sexual activity: Yes  Other Topics Concern   Not on file  Social History Narrative   Not on file   Social Drivers of Health   Financial Resource Strain: Not on file  Food Insecurity: Not on file  Transportation Needs: Not on file  Physical Activity: Not on file  Stress: Not on file  Social Connections: Not on file   Family History  Problem Relation Age of Onset   Prostate cancer Father    Breast cancer Maternal Grandmother 59   Eczema Son    No  Known Allergies Current Outpatient Medications  Medication Sig Dispense Refill   acyclovir  (ZOVIRAX ) 200 MG capsule Take 4 capsules (800 mg total) by mouth 2 (two) times daily. Take for 5 days as needed for flares 90 capsule 1   Current Facility-Administered Medications  Medication Dose Route Frequency Provider Last Rate Last Admin   omalizumab  (XOLAIR ) prefilled syringe 300 mg  300 mg Subcutaneous Q21 days Trudy Fusi, DO   300 mg at 03/08/23 1125   No results found.  Review of Systems:   A ROS was performed including pertinent positives and negatives as documented in the HPI.  Physical Exam :   Constitutional: NAD and appears stated age Neurological: Alert and oriented Psych: Appropriate affect and cooperative There were no vitals taken for this visit.   Comprehensive Musculoskeletal Exam:    Right knee exam demonstrates tenderness with palpation over the medial joint line.  Active range of motion from 0 to 130 degrees, with pain noted in terminal flexion.  Mild effusion present.  Positive McMurray.  No laxity with varus and valgus stress.  Imaging:   Xray (right knee 4 views): Negative for bony abnormality   I personally reviewed and interpreted the radiographs.      Assessment & Plan Right knee pain  Acute right knee pain with a popping sensation during a twisting movement gives concern for a meniscus root tear, given the pain pattern and injury mechanism. Pain is localized to the posterior knee with instability. X-rays show no arthritis or bony abnormalities. An MRI is recommended for further evaluation. A steroid injection is offered for temporary pain relief and improved function. Order an MRI of the right knee to evaluate for a meniscus tear. Administered a steroid injection to the right knee today after patient consent. Advise using pain as a guide for activity limitation. Plan a follow-up appointment after MRI for review and treatment discussion.     Procedure  Note  Patient: Theresa Rivera             Date of Birth: 05-29-1961           MRN: [id number]             Visit Date: 10/07/2023  Procedures: Visit Diagnoses:  1. Acute pain of right knee     Large Joint Inj: R knee on 10/07/2023 6:32 PM Indications: pain Details: 22 G 1.5 in needle, anterolateral approach Medications: 4 mL lidocaine  1 %; 2 mL triamcinolone  acetonide 40 MG/ML Outcome: tolerated well, no immediate complications Procedure, treatment alternatives, risks and benefits explained, specific risks discussed. Consent was given by the patient. Immediately prior to procedure a time out was called to verify the correct patient, procedure, equipment, support staff and site/side marked as required. Patient was prepped and draped in the usual sterile fashion.       I personally saw and evaluated the patient, and participated in the management and treatment plan.  Sharrell Deck, PA-C Orthopedics

## 2023-10-08 ENCOUNTER — Encounter (HOSPITAL_BASED_OUTPATIENT_CLINIC_OR_DEPARTMENT_OTHER): Payer: Self-pay

## 2023-10-08 MED ORDER — TRIAMCINOLONE ACETONIDE 40 MG/ML IJ SUSP
2.0000 mL | INTRAMUSCULAR | Status: AC | PRN
  Administered 2023-10-07: 2 mL via INTRA_ARTICULAR

## 2023-10-08 MED ORDER — LIDOCAINE HCL 1 % IJ SOLN
4.0000 mL | INTRAMUSCULAR | Status: AC | PRN
Start: 2023-10-07 — End: 2023-10-07
  Administered 2023-10-07: 4 mL

## 2023-10-11 ENCOUNTER — Telehealth (HOSPITAL_BASED_OUTPATIENT_CLINIC_OR_DEPARTMENT_OTHER): Payer: Self-pay | Admitting: Orthopaedic Surgery

## 2023-10-11 ENCOUNTER — Other Ambulatory Visit

## 2023-10-11 NOTE — Telephone Encounter (Signed)
 Theresa Rivera

## 2023-10-18 ENCOUNTER — Ambulatory Visit
Admission: RE | Admit: 2023-10-18 | Discharge: 2023-10-18 | Disposition: A | Discharge status: Home / Self Care | Source: Ambulatory Visit | Attending: Student | Admitting: Student

## 2023-10-18 DIAGNOSIS — M25561 Pain in right knee: Secondary | ICD-10-CM

## 2023-10-19 ENCOUNTER — Other Ambulatory Visit

## 2023-10-21 ENCOUNTER — Encounter (HOSPITAL_BASED_OUTPATIENT_CLINIC_OR_DEPARTMENT_OTHER): Payer: Self-pay

## 2023-11-03 ENCOUNTER — Ambulatory Visit (HOSPITAL_BASED_OUTPATIENT_CLINIC_OR_DEPARTMENT_OTHER): Admitting: Orthopaedic Surgery

## 2023-11-03 ENCOUNTER — Encounter (HOSPITAL_BASED_OUTPATIENT_CLINIC_OR_DEPARTMENT_OTHER): Payer: Self-pay | Admitting: Orthopaedic Surgery

## 2023-11-03 ENCOUNTER — Ambulatory Visit (HOSPITAL_BASED_OUTPATIENT_CLINIC_OR_DEPARTMENT_OTHER): Payer: Self-pay | Admitting: Orthopaedic Surgery

## 2023-11-03 DIAGNOSIS — S83241A Other tear of medial meniscus, current injury, right knee, initial encounter: Secondary | ICD-10-CM

## 2023-11-03 NOTE — Progress Notes (Signed)
 Chief Complaint: Right knee injury      11/03/2023: Presents today for follow-up of her right knee.  She got approximately 1 week of relief from her injection and is now having persistent pain with activity.  This is limiting her ability to golf   History of Present Illness The patient, with no prior history of knee issues, presents with right knee pain that started a couple of weeks ago. The onset of pain coincided with physically maneuvering her late mother, suggesting a possible injury due to strain or awkward movement. The patient describes a popping sensation in the knee, localized pain at the back and side of the knee, and a feeling of instability, as if the knee is going to buckle. The pain and instability have progressively worsened over the past few weeks, impacting the patient's mobility, particularly when navigating stairs.     Surgical History:   None   PMH/PSH/Family History/Social History/Meds/Allergies:   History reviewed. No pertinent past medical history.     History reviewed. No pertinent surgical history.     Social History         Socioeconomic History   Marital status: Married      Spouse name: Not on file   Number of children: Not on file   Years of education: Not on file   Highest education level: Not on file  Occupational History   Not on file  Tobacco Use   Smoking status: Never   Smokeless tobacco: Never  Substance and Sexual Activity   Alcohol use: Yes      Alcohol/week: 7.0 standard drinks of alcohol      Types: 7 Glasses of wine per week   Drug use: Never   Sexual activity: Yes  Other Topics Concern   Not on file  Social History Narrative   Not on file    Social Drivers of Health    Financial Resource Strain: Not on file  Food Insecurity: Not on file  Transportation Needs: Not on file  Physical Activity: Not on file  Stress: Not on file  Social Connections: Not on file         Family History  Problem Relation  Age of Onset   Prostate cancer Father     Breast cancer Maternal Grandmother 42   Eczema Son          Allergies  No Known Allergies         Current Outpatient Medications  Medication Sig Dispense Refill   acyclovir  (ZOVIRAX ) 200 MG capsule Take 4 capsules (800 mg total) by mouth 2 (two) times daily. Take for 5 days as needed for flares 90 capsule 1               Current Facility-Administered Medications  Medication Dose Route Frequency Provider Last Rate Last Admin   omalizumab  (XOLAIR ) prefilled syringe 300 mg  300 mg Subcutaneous Q21 days Trudy Fusi, DO   300 mg at 03/08/23 1125      Imaging Results (Last 48 hours)  No results found.     Review of Systems:   A ROS was performed including pertinent positives and negatives as documented in the HPI.   Physical Exam :   Constitutional: NAD and appears stated age Neurological: Alert and oriented Psych: Appropriate affect and cooperative There were no vitals taken for this visit.    Comprehensive Musculoskeletal Exam:     Right knee exam demonstrates tenderness with palpation over the medial joint  line.  Active range of motion from 0 to 130 degrees, with pain noted in terminal flexion.  Mild effusion present.  Positive McMurray.  No laxity with varus and valgus stress.   Imaging:   Xray (right knee 4 views): Negative for bony abnormality   MRI right knee: Medial meniscal tear with evidence of extrusion I personally reviewed and interpreted the radiographs.          Assessment & Plan Right knee pain   Acute right knee pain with right knee medial meniscal tear with some extrusion.  Today I did discuss her treatment options.  She has been working on strengthening the knee as well as had an injection.  Given this we did discuss the possibility of arthroscopic surgery.  I discussed the risks and limitations associated with this I discussed the associated recovery time allow for immediate weightbearing.  After discussion  likely she will proceed with right knee arthroscopy with medial meniscal repair  - Plan for likely right knee arthroscopy with medial meniscal repair   After a lengthy discussion of treatment options, including risks, benefits, alternatives, complications of surgical and nonsurgical conservative options, the patient elected surgical repair.   The patient  is aware of the material risks  and complications including, but not limited to injury to adjacent structures, neurovascular injury, infection, numbness, bleeding, implant failure, thermal burns, stiffness, persistent pain, failure to heal, disease transmission from allograft, need for further surgery, dislocation, anesthetic risks, blood clots, risks of death,and others. The probabilities of surgical success and failure discussed with patient given their particular co-morbidities.The time and nature of expected rehabilitation and recovery was discussed.The patient's questions were all answered preoperatively.  No barriers to understanding were noted. I explained the natural history of the disease process and Rx rationale.  I explained to the patient what I considered to be reasonable expectations given their personal situation.  The final treatment plan was arrived at through a shared patient decision making process model.                 I personally saw and evaluated the patient, and participated in the management and treatment plan.

## 2023-11-05 ENCOUNTER — Ambulatory Visit: Admitting: Family Medicine

## 2023-11-08 ENCOUNTER — Encounter (HOSPITAL_BASED_OUTPATIENT_CLINIC_OR_DEPARTMENT_OTHER): Payer: Self-pay | Admitting: Orthopaedic Surgery

## 2023-11-09 ENCOUNTER — Encounter (HOSPITAL_BASED_OUTPATIENT_CLINIC_OR_DEPARTMENT_OTHER): Payer: Self-pay | Admitting: Orthopaedic Surgery

## 2023-11-09 NOTE — Telephone Encounter (Signed)
 I spoke with the patient and scheduled her for surgery on 11/23/23.

## 2023-11-10 ENCOUNTER — Encounter (HOSPITAL_BASED_OUTPATIENT_CLINIC_OR_DEPARTMENT_OTHER): Payer: Self-pay | Admitting: Orthopaedic Surgery

## 2023-11-10 NOTE — Telephone Encounter (Signed)
 Surgery for 6/10 has been canceled.

## 2023-11-11 ENCOUNTER — Ambulatory Visit: Admitting: Family Medicine

## 2023-11-17 ENCOUNTER — Ambulatory Visit (HOSPITAL_BASED_OUTPATIENT_CLINIC_OR_DEPARTMENT_OTHER): Admitting: Orthopaedic Surgery

## 2023-11-23 ENCOUNTER — Ambulatory Visit (HOSPITAL_BASED_OUTPATIENT_CLINIC_OR_DEPARTMENT_OTHER): Admit: 2023-11-23 | Admitting: Orthopaedic Surgery

## 2023-11-23 ENCOUNTER — Encounter (HOSPITAL_BASED_OUTPATIENT_CLINIC_OR_DEPARTMENT_OTHER): Payer: Self-pay

## 2023-11-23 SURGERY — ARTHROSCOPY, KNEE, WITH MENISCUS REPAIR
Anesthesia: General | Site: Knee | Laterality: Right

## 2023-12-06 ENCOUNTER — Encounter (HOSPITAL_BASED_OUTPATIENT_CLINIC_OR_DEPARTMENT_OTHER): Admitting: Orthopaedic Surgery

## 2024-01-07 ENCOUNTER — Telehealth: Payer: Self-pay | Admitting: Family Medicine

## 2024-01-07 NOTE — Telephone Encounter (Signed)
 Called pt to r/s OV from 01/21/24. Patient states her medicare coverage starts on 01/21/24 and wanted to know if she could have her CPE done during her surgical clearance visit instead of in November as it's now scheduled. Please advise.

## 2024-01-10 ENCOUNTER — Telehealth: Payer: Self-pay | Admitting: *Deleted

## 2024-01-10 NOTE — Telephone Encounter (Signed)
 That's fine. Thanks.  [employer] HER[employer]. Kennyth, MD 01/10/2024 3:13 PM

## 2024-01-10 NOTE — Telephone Encounter (Signed)
 Please see pt msg and advise

## 2024-01-11 NOTE — Telephone Encounter (Signed)
**Note De-identified  [place] Obfuscation** Please advise 

## 2024-01-12 NOTE — Telephone Encounter (Signed)
 Error

## 2024-01-18 ENCOUNTER — Other Ambulatory Visit: Payer: Self-pay | Admitting: Medical Genetics

## 2024-01-21 ENCOUNTER — Ambulatory Visit: Admitting: Family Medicine

## 2024-01-24 ENCOUNTER — Encounter (HOSPITAL_BASED_OUTPATIENT_CLINIC_OR_DEPARTMENT_OTHER): Payer: Self-pay | Admitting: Orthopaedic Surgery

## 2024-01-24 ENCOUNTER — Ambulatory Visit: Admitting: Family Medicine

## 2024-01-25 ENCOUNTER — Encounter: Payer: Self-pay | Admitting: Family Medicine

## 2024-01-25 ENCOUNTER — Other Ambulatory Visit: Payer: Self-pay

## 2024-01-25 ENCOUNTER — Ambulatory Visit: Admitting: Family Medicine

## 2024-01-25 VITALS — BP 125/81 | HR 93 | Temp 97.3°F | Ht 67.0 in | Wt 163.4 lb

## 2024-01-25 DIAGNOSIS — Z0000 Encounter for general adult medical examination without abnormal findings: Secondary | ICD-10-CM

## 2024-01-25 DIAGNOSIS — Z23 Encounter for immunization: Secondary | ICD-10-CM | POA: Diagnosis not present

## 2024-01-25 DIAGNOSIS — E785 Hyperlipidemia, unspecified: Secondary | ICD-10-CM

## 2024-01-25 DIAGNOSIS — R739 Hyperglycemia, unspecified: Secondary | ICD-10-CM | POA: Diagnosis not present

## 2024-01-25 LAB — COMPREHENSIVE METABOLIC PANEL WITH GFR
ALT: 13 U/L (ref 0–35)
AST: 18 U/L (ref 0–37)
Albumin: 4.5 g/dL (ref 3.5–5.2)
Alkaline Phosphatase: 50 U/L (ref 39–117)
BUN: 18 mg/dL (ref 6–23)
CO2: 33 meq/L — ABNORMAL HIGH (ref 19–32)
Calcium: 10 mg/dL (ref 8.4–10.5)
Chloride: 99 meq/L (ref 96–112)
Creatinine, Ser: 0.82 mg/dL (ref 0.40–1.20)
GFR: 75.32 mL/min (ref >60.00)
Glucose, Bld: 67 mg/dL — ABNORMAL LOW (ref 70–99)
Potassium: 3.9 meq/L (ref 3.5–5.1)
Sodium: 139 meq/L (ref 135–145)
Total Bilirubin: 0.8 mg/dL (ref 0.2–1.2)
Total Protein: 7.5 g/dL (ref 6.0–8.3)

## 2024-01-25 LAB — LIPID PANEL
Cholesterol: 226 mg/dL — ABNORMAL HIGH (ref 0–200)
HDL: 70.3 mg/dL (ref >39.00)
LDL Cholesterol: 134 mg/dL — ABNORMAL HIGH (ref 0–99)
NonHDL: 155.94
Total CHOL/HDL Ratio: 3
Triglycerides: 109 mg/dL (ref 0.0–149.0)
VLDL: 21.8 mg/dL (ref 0.0–40.0)

## 2024-01-25 LAB — CBC
HCT: 43.6 % (ref 36.0–46.0)
Hemoglobin: 15.1 g/dL — ABNORMAL HIGH (ref 12.0–15.0)
MCHC: 34.6 g/dL (ref 30.0–36.0)
MCV: 94.2 fl (ref 78.0–100.0)
Platelets: 311 K/uL (ref 150.0–400.0)
RBC: 4.62 Mil/uL (ref 3.87–5.11)
RDW: 12 % (ref 11.5–15.5)
WBC: 5.8 K/uL (ref 4.0–10.5)

## 2024-01-25 LAB — HEMOGLOBIN A1C: Hgb A1c MFr Bld: 5.3 % (ref 4.6–6.5)

## 2024-01-25 LAB — TSH: TSH: 0.88 u[IU]/mL (ref 0.35–5.50)

## 2024-01-25 NOTE — Patient Instructions (Addendum)
 It was very nice to see you today!  VISIT SUMMARY:  Today, you had a routine Medicare wellness visit and a preoperative evaluation for your upcoming knee surgery.   YOUR PLAN:  ADULT WELLNESS VISIT: Routine wellness visit with no acute concerns. . -You are cleared for arthroscopic surgery pending normal blood work and EKG. -We discussed genetic testing but decided to defer it for now. -We also discussed advanced directives. -We will perform blood work including cholesterol, A1c, blood counts, kidney function, liver function, and electrolytes. - Your EKG is normal. -Proceed with the preoperative evaluation for your arthroscopic surgery. -Schedule a follow-up in one year unless any issues arise.  TEAR OF MENISCUS OF KNEE: Meniscal tear due to injury. You are scheduled for arthroscopic surgery and are eager to regain mobility. -Proceed with arthroscopic surgery as scheduled. -Begin physical therapy after surgery. -Resume gym activities as soon as you are medically cleared.  Return in about 1 year (around 01/24/2025) for Annual Physical.   Take care, Dr Kennyth  PLEASE NOTE:  If you had any lab tests, please let us  know if you have not heard back within a few days. You may see your results on mychart before we have a chance to review them but we will give you a call once they are reviewed by us .   If we ordered any referrals today, please let us  know if you have not heard from their office within the next week.   If you had any urgent prescriptions sent in today, please check with the pharmacy within an hour of our visit to make sure the prescription was transmitted appropriately.   Please try these tips to maintain a healthy lifestyle:  Eat at least 3 REAL meals and 1-2 snacks per day.  Aim for no more than 5 hours between eating.  If you eat breakfast, please do so within one hour of getting up.   Each meal should contain half fruits/vegetables, one quarter protein, and one quarter  carbs (no bigger than a computer mouse)  Cut down on sweet beverages. This includes juice, soda, and sweet tea.   Drink at least 1 glass of water with each meal and aim for at least 8 glasses per day  Exercise at least 150 minutes every week.    Preventive Care 65-34 Years Old, Female Preventive care refers to lifestyle choices and visits with your health care provider that can promote health and wellness. Preventive care visits are also called wellness exams. What can I expect for my preventive care visit? Counseling Your health care provider may ask you questions about your: Medical history, including: Past medical problems. Family medical history. Pregnancy history. Current health, including: Menstrual cycle. Method of birth control. Emotional well-being. Home life and relationship well-being. Sexual activity and sexual health. Lifestyle, including: Alcohol, nicotine or tobacco, and drug use. Access to firearms. Diet, exercise, and sleep habits. Work and work Astronomer. Sunscreen use. Safety issues such as seatbelt and bike helmet use. Physical exam Your health care provider will check your: Height and weight. These may be used to calculate your BMI (body mass index). BMI is a measurement that tells if you are at a healthy weight. Waist circumference. This measures the distance around your waistline. This measurement also tells if you are at a healthy weight and may help predict your risk of certain diseases, such as type 2 diabetes and high blood pressure. Heart rate and blood pressure. Body temperature. Skin for abnormal spots. What immunizations do I  need?  Vaccines are usually given at various ages, according to a schedule. Your health care provider will recommend vaccines for you based on your age, medical history, and lifestyle or other factors, such as travel or where you work. What tests do I need? Screening Your health care provider may recommend screening  tests for certain conditions. This may include: Lipid and cholesterol levels. Diabetes screening. This is done by checking your blood sugar (glucose) after you have not eaten for a while (fasting). Pelvic exam and Pap test. Hepatitis B test. Hepatitis C test. HIV (human immunodeficiency virus) test. STI (sexually transmitted infection) testing, if you are at risk. Lung cancer screening. Colorectal cancer screening. Mammogram. Talk with your health care provider about when you should start having regular mammograms. This may depend on whether you have a family history of breast cancer. BRCA-related cancer screening. This may be done if you have a family history of breast, ovarian, tubal, or peritoneal cancers. Bone density scan. This is done to screen for osteoporosis. Talk with your health care provider about your test results, treatment options, and if necessary, the need for more tests. Follow these instructions at home: Eating and drinking  Eat a diet that includes fresh fruits and vegetables, whole grains, lean protein, and low-fat dairy products. Take vitamin and mineral supplements as recommended by your health care provider. Do not drink alcohol if: Your health care provider tells you not to drink. You are pregnant, may be pregnant, or are planning to become pregnant. If you drink alcohol: Limit how much you have to 0-1 drink a day. Know how much alcohol is in your drink. In the [place] one drink equals one 12 oz bottle of beer (355 mL), one 5 oz glass of wine (148 mL), or one 1 oz glass of hard liquor (44 mL). Lifestyle Brush your teeth every morning and night with fluoride toothpaste. Floss one time each day. Exercise for at least 30 minutes 5 or more days each week. Do not use any products that contain nicotine or tobacco. These products include cigarettes, chewing tobacco, and vaping devices, such as e-cigarettes. If you need help quitting, ask your health care provider. Do not  use drugs. If you are sexually active, practice safe sex. Use a condom or other form of protection to prevent STIs. If you do not wish to become pregnant, use a form of birth control. If you plan to become pregnant, see your health care provider for a prepregnancy visit. Take aspirin only as told by your health care provider. Make sure that you understand how much to take and what form to take. Work with your health care provider to find out whether it is safe and beneficial for you to take aspirin daily. Find healthy ways to manage stress, such as: Meditation, yoga, or listening to music. Journaling. Talking to a trusted person. Spending time with friends and family. Minimize exposure to UV radiation to reduce your risk of skin cancer. Safety Always wear your seat belt while driving or riding in a vehicle. Do not drive: If you have been drinking alcohol. Do not ride with someone who has been drinking. When you are tired or distracted. While texting. If you have been using any mind-altering substances or drugs. Wear a helmet and other protective equipment during sports activities. If you have firearms in your house, make sure you follow all gun safety procedures. Seek help if you have been physically or sexually abused. What's next? Visit your health care provider  once a year for an annual wellness visit. Ask your health care provider how often you should have your eyes and teeth checked. Stay up to date on all vaccines. This information is not intended to replace advice given to you by your health care provider. Make sure you discuss any questions you have with your health care provider. Document Revised: 12/04/2020 Document Reviewed: 12/04/2020 Elsevier Patient Education  2024 [employer].

## 2024-01-25 NOTE — Assessment & Plan Note (Signed)
 Check lipids. Discussed lifestyle modifications.

## 2024-01-25 NOTE — Progress Notes (Signed)
 Chief Complaint:  Theresa Rivera is a 63 y.o. female who presents today for a Medicare Initial Preventive Examination.  Assessment/Plan:  New/Acute Problems:  Assessment & Plan Tear of meniscus of knee   Meniscal tear due to injury. She is scheduled for arthroscopic surgery and is eager to regain mobility.  She is overall low risk for complication from surgery.  Proceed with arthroscopic surgery as scheduled. Begin physical therapy post-surgery. Resume gym activities as soon as medically cleared.    Chronic Problems Addressed Today: Dyslipidemia Check lipids.  Discussed lifestyle modifications.  Hyperglycemia Check A1c.   Preventative Healthcare Health Maintenance  Topic Date Due   INFLUENZA VACCINE  09/19/2024 (Originally 01/21/2024)   MAMMOGRAM  06/13/2024   Medicare Annual Wellness (AWV)  01/24/2025   Cervical Cancer Screening (HPV/Pap Cotest)  04/21/2027   Colonoscopy  06/19/2029   DTaP/Tdap/Td (3 - Td or Tdap) 03/18/2032   Pneumococcal Vaccine: 50+ Years  Completed   COVID-19 Vaccine  Completed   Hepatitis C Screening  Completed   HIV Screening  Completed   Zoster Vaccines- Shingrix  Completed   Pneumococcal Vaccine: 19-49 Years  Aged Out   Hepatitis B Vaccines  Aged Out   HPV VACCINES  Aged Out   Meningococcal B Vaccine  Aged Out  Prevnar 20 given today.  Due for colonoscopy in 5 years.  Up-to-date on cervical cancer screening.  Will get seasonal flu vaccine later this year.  Patient Counseling(The following topics were reviewed and/or handout was given):  -Nutrition: Stressed importance of moderation in sodium/caffeine intake, saturated fat and cholesterol, caloric balance, sufficient intake of fresh fruits, vegetables, and fiber.  -Stressed the importance of regular exercise.   -Substance Abuse: Discussed cessation/primary prevention of tobacco, alcohol, or other drug use; driving or other dangerous activities under the influence; availability of  treatment for abuse.   -Injury prevention: Discussed safety belts, safety helmets, smoke detector, smoking near bedding or upholstery.   -Sexuality: Discussed sexually transmitted diseases, partner selection, use of condoms, avoidance of unintended pregnancy and contraceptive alternatives.   -Dental health: Discussed importance of regular tooth brushing, flossing, and dental visits.  -Health maintenance and immunizations reviewed. Please refer to Health maintenance section.  Advance Directives were discussed with the patient.   Patient Instructions (the written plan) was given to the patient.  Medicare Attestation I have personally reviewed: The patient's medical and social history Their use of alcohol, tobacco or illicit drugs Their current medications and supplements The patient's functional ability including ADLs,fall risks, home safety risks, cognitive, and hearing and visual impairment Diet and physical activities Evidence for depression or mood disorders Her use of opioid medications, reviewed Opioid use disorder risk factors, evaluated pain severity and current treatment plan, and provided information on non-opioid treatment options   The patient's weight, height, BMI, and visual acuity have been recorded in the chart.  I have made referrals, counseling, and provided education to the patient based on review of the above and I have provided the patient with a written personalized care plan for preventive services.    During the course of the visit the patient was educated and counseled about appropriate screening and preventive services including:        Pneumococcal vaccine  Influenza vaccine Screening electrocardiogram Advanced directives: has NO advanced directive  - she will be completing this soon.     Subjective:  HPI:  See assessment / plan for status of chronic conditions. She is here today for her welcome to  medicare visit.   Discussed the use of AI scribe software  for clinical note transcription with the patient, who gave verbal consent to proceed.  History of Present Illness Theresa Rivera is a 63 year old female who presents for a welcome Medicare visit and preoperative evaluation for a meniscal tear.  She sustained a meniscal tear in 11/02/2023 while lifting her mother, which has limited her ability to engage in activities like golf and walking. She plans to start physical therapy and return to the gym after surgery.  Her mother passed away in 2023-11-02 after a rapid decline due to COVID-19, leading to significant stress. She manages well with support from her husband and family.  She has had no falls in the past year, no vision or hearing issues, and feels safe at home. She manages personal hygiene, bathing, and finances independently.  She is current with colonoscopy and mammogram screenings. She remains active with family activities and recently traveled to [place] for her father's 90th birthday.    Activities of Daily Living: He has no difficulty performing the following: Preparing food and eating Bathing  Getting dressed Using the toilet Shopping Managing Finances Moving around from place to place  Fall Screening: She has not had any falls within the past year.   Hearing Screening: Patient has no concerns over hearing.  Safety Screening Patient feels safe at home.  There are no smokers at home.   Depression Screening:    01/25/2024    8:43 AM  Depression screen PHQ 2/9  Decreased Interest 0  Down, Depressed, Hopeless 0  PHQ - 2 Score 0    Lifestyle Factors: Diet: Limited recently.  Exercise: Limited recently.    Patient Care Team: Kennyth Worth HERO, MD as PCP - General (Family Medicine)   ROS: Per HPI  PMH:  The following were reviewed and entered/updated in epic: History reviewed. No pertinent past medical history. Past Surgical History:  Procedure Laterality Date   RHINOPLASTY  39   Family  History  Problem Relation Age of Onset   Prostate cancer Father    Breast cancer Maternal Grandmother 110   Eczema Son     Medications- reviewed and updated Current Outpatient Medications  Medication Sig Dispense Refill   acyclovir  (ZOVIRAX ) 200 MG capsule Take 4 capsules (800 mg total) by mouth 2 (two) times daily. Take for 5 days as needed for flares 90 capsule 1   Current Facility-Administered Medications  Medication Dose Route Frequency Provider Last Rate Last Admin   omalizumab  (XOLAIR ) prefilled syringe 300 mg  300 mg Subcutaneous Q21 days Luke Orlan HERO, DO   300 mg at 03/08/23 1125    Allergies-reviewed and updated No Known Allergies  Social History   Socioeconomic History   Marital status: Married    Spouse name: Not on file   Number of children: Not on file   Years of education: Not on file   Highest education level: Master's degree (e.g., MA, MS, MEng, MEd, MSW, MBA)  Occupational History   Not on file  Tobacco Use   Smoking status: Never   Smokeless tobacco: Never  Vaping Use   Vaping status: Never Used  Substance and Sexual Activity   Alcohol use: Yes    Alcohol/week: 7.0 standard drinks of alcohol    Types: 7 Glasses of wine per week    Comment: occasional   Drug use: Never   Sexual activity: Yes  Other Topics Concern   Not on file  Social History  Narrative   Not on file   Social Drivers of Health   Financial Resource Strain: Low Risk  (01/24/2024)   Overall Financial Resource Strain (CARDIA)    Difficulty of Paying Living Expenses: Not hard at all  Food Insecurity: No Food Insecurity (01/24/2024)   Hunger Vital Sign    Worried About Running Out of Food in the Last Year: Never true    Ran Out of Food in the Last Year: Never true  Transportation Needs: No Transportation Needs (01/24/2024)   PRAPARE - Administrator, Civil Service (Medical): No    Lack of Transportation (Non-Medical): No  Physical Activity: Unknown (01/24/2024)   Exercise Vital  Sign    Days of Exercise per Week: Patient declined    Minutes of Exercise per Session: Not on file  Stress: No Stress Concern Present (01/24/2024)   [employer] of Occupational Health - Occupational Stress Questionnaire    Feeling of Stress: Not at all  Social Connections: Socially Integrated (01/24/2024)   Social Connection and Isolation Panel    Frequency of Communication with Friends and Family: More than three times a week    Frequency of Social Gatherings with Friends and Family: More than three times a week    Attends Religious Services: More than 4 times per year    Active Member of Clubs or Organizations: Yes    Attends Engineer, structural: More than 4 times per year    Marital Status: Married         Objective/Observations  Physical Exam: BP 125/81   Pulse 93   Temp (!) 97.3 F (36.3 C) (Temporal)   Ht 5' 7 (1.702 m)   Wt 163 lb 6.4 oz (74.1 kg)   SpO2 98%   BMI 25.59 kg/m  Wt Readings from Last 3 Encounters:  01/25/24 163 lb 6.4 oz (74.1 kg)  07/28/23 166 lb (75.3 kg)  07/22/23 166 lb 3.2 oz (75.4 kg)       01/25/2024    8:44 AM 01/24/2024    4:34 PM 07/22/2023    8:25 AM 04/22/2023    1:16 PM 04/20/2022    8:39 AM  Fall Risk   Falls in the past year? 0 0 0 0 0  Number falls in past yr: 0  0 0 0  Injury with Fall? 0 0 0 0 0  Risk for fall due to : No Fall Risks  No Fall Risks No Fall Risks No Fall Risks   Hearing Screening  Method: Auditory brainstem response    Right ear  Left ear  Comments: Pass Bilateral   Vision Screening   Right eye Left eye Both eyes  Without correction 20/20 20/100 20/20  With correction      Gen: NAD, resting comfortably CV: RRR with no murmurs appreciated Pulm: NWOB, CTAB with no crackles, wheezes, or rhonchi GI: Normal bowel sounds present. Soft, Nontender, Nondistended. MSK: no edema, cyanosis, or clubbing noted Skin: warm, dry Neuro: grossly normal, moves all extremities Psych: Normal affect and thought  content. Normal minicog.    EKG: NSR.  No ischemic changes.         Worth HERO. Kennyth, MD 01/25/2024 9:48 AM

## 2024-01-25 NOTE — Assessment & Plan Note (Signed)
 Check A1c.

## 2024-01-26 ENCOUNTER — Ambulatory Visit: Payer: Self-pay | Admitting: Family Medicine

## 2024-01-26 DIAGNOSIS — E785 Hyperlipidemia, unspecified: Secondary | ICD-10-CM

## 2024-01-26 NOTE — Progress Notes (Signed)
 Her cholesterol is a little elevated but similar to her past previous values.  Do not need to make any changes to treatment plan.  She should continue to work on diet and exercise and we can recheck this again in a year.  All of her other labs are within acceptable range.

## 2024-01-31 ENCOUNTER — Other Ambulatory Visit: Payer: Self-pay

## 2024-01-31 ENCOUNTER — Ambulatory Visit (HOSPITAL_BASED_OUTPATIENT_CLINIC_OR_DEPARTMENT_OTHER): Admitting: Anesthesiology

## 2024-01-31 ENCOUNTER — Encounter (HOSPITAL_BASED_OUTPATIENT_CLINIC_OR_DEPARTMENT_OTHER): Payer: Self-pay | Admitting: Orthopaedic Surgery

## 2024-01-31 ENCOUNTER — Ambulatory Visit (HOSPITAL_BASED_OUTPATIENT_CLINIC_OR_DEPARTMENT_OTHER)
Admission: RE | Admit: 2024-01-31 | Discharge: 2024-01-31 | Disposition: A | Discharge status: Home / Self Care | Attending: Orthopaedic Surgery | Admitting: Orthopaedic Surgery

## 2024-01-31 ENCOUNTER — Other Ambulatory Visit (HOSPITAL_BASED_OUTPATIENT_CLINIC_OR_DEPARTMENT_OTHER): Payer: Self-pay | Admitting: Orthopaedic Surgery

## 2024-01-31 ENCOUNTER — Encounter (HOSPITAL_BASED_OUTPATIENT_CLINIC_OR_DEPARTMENT_OTHER)
Admission: RE | Disposition: A | Discharge status: Home / Self Care | Payer: Self-pay | Source: Home / Self Care | Attending: Orthopaedic Surgery

## 2024-01-31 DIAGNOSIS — S83241D Other tear of medial meniscus, current injury, right knee, subsequent encounter: Secondary | ICD-10-CM | POA: Diagnosis not present

## 2024-01-31 DIAGNOSIS — R739 Hyperglycemia, unspecified: Secondary | ICD-10-CM

## 2024-01-31 DIAGNOSIS — X58XXXA Exposure to other specified factors, initial encounter: Secondary | ICD-10-CM | POA: Diagnosis not present

## 2024-01-31 DIAGNOSIS — S83241A Other tear of medial meniscus, current injury, right knee, initial encounter: Secondary | ICD-10-CM | POA: Diagnosis present

## 2024-01-31 HISTORY — PX: KNEE ARTHROSCOPY WITH MENISCAL REPAIR: SHX5653

## 2024-01-31 SURGERY — ARTHROSCOPY, KNEE, WITH MENISCUS REPAIR
Anesthesia: General | Site: Knee | Laterality: Right

## 2024-01-31 MED ORDER — IBUPROFEN 800 MG PO TABS: 800.0000 mg | ORAL_TABLET | Freq: Three times a day (TID) | ORAL | 0 refills | Status: AC

## 2024-01-31 MED ORDER — ACETAMINOPHEN 500 MG PO TABS: 500.0000 mg | ORAL_TABLET | Freq: Three times a day (TID) | ORAL | 0 refills | Status: AC

## 2024-01-31 MED ORDER — PHENYLEPHRINE 80 MCG/ML (10ML) SYRINGE FOR IV PUSH (FOR BLOOD PRESSURE SUPPORT)
PREFILLED_SYRINGE | INTRAVENOUS | Status: AC
  Filled 2024-01-31: qty 10

## 2024-01-31 MED ORDER — ONDANSETRON HCL 4 MG/2ML IJ SOLN
INTRAMUSCULAR | Status: DC | PRN
  Administered 2024-01-31 (×2): 4 mg via INTRAVENOUS

## 2024-01-31 MED ORDER — DEXAMETHASONE SODIUM PHOSPHATE 10 MG/ML IJ SOLN
INTRAMUSCULAR | Status: DC | PRN
  Administered 2024-01-31 (×2): 10 mg via INTRAVENOUS

## 2024-01-31 MED ORDER — SODIUM CHLORIDE 0.9 % IR SOLN
Status: DC | PRN
  Administered 2024-01-31 (×2): 1000 mL

## 2024-01-31 MED ORDER — GLYCOPYRROLATE PF 0.2 MG/ML IJ SOSY
PREFILLED_SYRINGE | INTRAMUSCULAR | Status: AC
  Filled 2024-01-31: qty 1

## 2024-01-31 MED ORDER — CEFAZOLIN SODIUM-DEXTROSE 2-4 GM/100ML-% IV SOLN
INTRAVENOUS | Status: AC
  Filled 2024-01-31: qty 100

## 2024-01-31 MED ORDER — ACETAMINOPHEN 500 MG PO TABS
1000.0000 mg | ORAL_TABLET | Freq: Once | ORAL | Status: AC
  Administered 2024-01-31 (×2): 1000 mg via ORAL

## 2024-01-31 MED ORDER — GABAPENTIN 300 MG PO CAPS
ORAL_CAPSULE | ORAL | Status: AC
  Filled 2024-01-31: qty 1

## 2024-01-31 MED ORDER — TRANEXAMIC ACID 1000 MG/10ML IV SOLN
INTRAVENOUS | Status: DC | PRN
  Administered 2024-01-31 (×2): 1000 mg via INTRAVENOUS

## 2024-01-31 MED ORDER — CEFAZOLIN SODIUM-DEXTROSE 2-4 GM/100ML-% IV SOLN: 2.0000 g | INTRAVENOUS | Status: DC

## 2024-01-31 MED ORDER — FENTANYL CITRATE (PF) 100 MCG/2ML IJ SOLN
INTRAMUSCULAR | Status: DC | PRN
  Administered 2024-01-31 (×2): 50 ug via INTRAVENOUS

## 2024-01-31 MED ORDER — PROPOFOL 10 MG/ML IV BOLUS
INTRAVENOUS | Status: DC | PRN
  Administered 2024-01-31 (×2): 160 mg via INTRAVENOUS

## 2024-01-31 MED ORDER — BUPIVACAINE HCL (PF) 0.25 % IJ SOLN
INTRAMUSCULAR | Status: DC | PRN
  Administered 2024-01-31 (×2): 15 mL

## 2024-01-31 MED ORDER — MIDAZOLAM HCL 5 MG/5ML IJ SOLN
INTRAMUSCULAR | Status: DC | PRN
  Administered 2024-01-31 (×2): 2 mg via INTRAVENOUS

## 2024-01-31 MED ORDER — ASPIRIN 325 MG PO TBEC: 325.0000 mg | DELAYED_RELEASE_TABLET | Freq: Every day | ORAL | 0 refills | Status: DC

## 2024-01-31 MED ORDER — LIDOCAINE 2% (20 MG/ML) 5 ML SYRINGE
INTRAMUSCULAR | Status: AC
  Filled 2024-01-31: qty 5

## 2024-01-31 MED ORDER — HYDROMORPHONE HCL 1 MG/ML IJ SOLN
INTRAMUSCULAR | Status: AC
  Filled 2024-01-31: qty 0.5

## 2024-01-31 MED ORDER — MIDAZOLAM HCL 2 MG/2ML IJ SOLN
INTRAMUSCULAR | Status: AC
  Filled 2024-01-31: qty 2

## 2024-01-31 MED ORDER — PHENYLEPHRINE 80 MCG/ML (10ML) SYRINGE FOR IV PUSH (FOR BLOOD PRESSURE SUPPORT)
PREFILLED_SYRINGE | INTRAVENOUS | Status: DC | PRN
Start: 2024-01-31 — End: 2024-01-31
  Administered 2024-01-31 (×2): 40 ug via INTRAVENOUS

## 2024-01-31 MED ORDER — ONDANSETRON HCL 4 MG/2ML IJ SOLN
INTRAMUSCULAR | Status: AC
  Filled 2024-01-31: qty 2

## 2024-01-31 MED ORDER — PROPOFOL 500 MG/50ML IV EMUL
INTRAVENOUS | Status: AC
  Filled 2024-01-31: qty 50

## 2024-01-31 MED ORDER — TRANEXAMIC ACID-NACL 1000-0.7 MG/100ML-% IV SOLN
INTRAVENOUS | Status: AC
  Filled 2024-01-31: qty 100

## 2024-01-31 MED ORDER — ACETAMINOPHEN 500 MG PO TABS
ORAL_TABLET | ORAL | Status: AC
  Filled 2024-01-31: qty 2

## 2024-01-31 MED ORDER — AMISULPRIDE (ANTIEMETIC) 5 MG/2ML IV SOLN
10.0000 mg | Freq: Once | INTRAVENOUS | Status: AC
  Administered 2024-01-31 (×2): 10 mg via INTRAVENOUS

## 2024-01-31 MED ORDER — DEXAMETHASONE SODIUM PHOSPHATE 10 MG/ML IJ SOLN
INTRAMUSCULAR | Status: AC
  Filled 2024-01-31: qty 1

## 2024-01-31 MED ORDER — OXYCODONE HCL 5 MG/5ML PO SOLN: 5.0000 mg | Freq: Once | ORAL | Status: DC | PRN

## 2024-01-31 MED ORDER — ROCURONIUM BROMIDE 10 MG/ML (PF) SYRINGE
PREFILLED_SYRINGE | INTRAVENOUS | Status: AC
  Filled 2024-01-31: qty 10

## 2024-01-31 MED ORDER — FENTANYL CITRATE (PF) 100 MCG/2ML IJ SOLN
INTRAMUSCULAR | Status: AC
  Filled 2024-01-31: qty 2

## 2024-01-31 MED ORDER — LACTATED RINGERS IV SOLN: INTRAVENOUS | Status: DC | PRN

## 2024-01-31 MED ORDER — KETOROLAC TROMETHAMINE 30 MG/ML IJ SOLN
INTRAMUSCULAR | Status: DC | PRN
  Administered 2024-01-31 (×2): 15 mg via INTRAVENOUS

## 2024-01-31 MED ORDER — GABAPENTIN 300 MG PO CAPS
300.0000 mg | ORAL_CAPSULE | Freq: Once | ORAL | Status: AC
  Administered 2024-01-31 (×2): 300 mg via ORAL

## 2024-01-31 MED ORDER — AMISULPRIDE (ANTIEMETIC) 5 MG/2ML IV SOLN
INTRAVENOUS | Status: AC
  Filled 2024-01-31: qty 4

## 2024-01-31 MED ORDER — HYDROMORPHONE HCL 1 MG/ML IJ SOLN
0.2500 mg | INTRAMUSCULAR | Status: DC | PRN
  Administered 2024-01-31 (×4): 0.5 mg via INTRAVENOUS

## 2024-01-31 MED ORDER — OXYCODONE HCL 5 MG PO TABS: 5.0000 mg | ORAL_TABLET | Freq: Once | ORAL | Status: DC | PRN

## 2024-01-31 MED ORDER — TRANEXAMIC ACID-NACL 1000-0.7 MG/100ML-% IV SOLN: 1000.0000 mg | INTRAVENOUS | Status: DC

## 2024-01-31 MED ORDER — ONDANSETRON HCL 4 MG/2ML IJ SOLN: 4.0000 mg | Freq: Once | INTRAMUSCULAR | Status: DC | PRN

## 2024-01-31 MED ORDER — LIDOCAINE 2% (20 MG/ML) 5 ML SYRINGE
INTRAMUSCULAR | Status: DC | PRN
Start: 2024-01-31 — End: 2024-01-31
  Administered 2024-01-31 (×2): 60 mg via INTRAVENOUS

## 2024-01-31 MED ORDER — LACTATED RINGERS IV SOLN: INTRAVENOUS | Status: DC

## 2024-01-31 MED ORDER — CEFAZOLIN SODIUM-DEXTROSE 2-3 GM-%(50ML) IV SOLR
INTRAVENOUS | Status: DC | PRN
  Administered 2024-01-31 (×2): 2 g via INTRAVENOUS

## 2024-01-31 MED ORDER — KETOROLAC TROMETHAMINE 30 MG/ML IJ SOLN
INTRAMUSCULAR | Status: AC
  Filled 2024-01-31: qty 1

## 2024-01-31 MED ORDER — OXYCODONE HCL 5 MG PO TABS: 5.0000 mg | ORAL_TABLET | ORAL | 0 refills | Status: DC | PRN

## 2024-01-31 SURGICAL SUPPLY — 42 items
ANCHOR SUT 1.8 FIBERTAK SB KL (Anchor) IMPLANT
BLADE EXCALIBUR 4.0X13 (MISCELLANEOUS) IMPLANT
BLADE SURG 15 STRL LF DISP TIS (BLADE) IMPLANT
BNDG ELASTIC 6INX 5YD STR LF (GAUZE/BANDAGES/DRESSINGS) ×1 IMPLANT
CHLORAPREP W/TINT 26 (MISCELLANEOUS) ×1 IMPLANT
COOLER ICEMAN CLASSIC (MISCELLANEOUS) ×1 IMPLANT
DISSECTOR 3.8MM X 13CM (MISCELLANEOUS) ×1 IMPLANT
DRAPE IMP U-DRAPE 54X76 (DRAPES) IMPLANT
DRAPE INCISE IOBAN 66X45 STRL (DRAPES) IMPLANT
DRAPE U-SHAPE 47X51 STRL (DRAPES) ×1 IMPLANT
DRAPE-T ARTHROSCOPY W/POUCH (DRAPES) ×1 IMPLANT
DW OUTFLOW CASSETTE/TUBE SET (MISCELLANEOUS) IMPLANT
ELECTRODE REM PT RTRN 9FT ADLT (ELECTROSURGICAL) IMPLANT
EXCALIBUR 3.8MM X 13CM (MISCELLANEOUS) IMPLANT
GAUZE PAD ABD 8X10 STRL (GAUZE/BANDAGES/DRESSINGS) IMPLANT
GAUZE SPONGE 4X4 12PLY STRL (GAUZE/BANDAGES/DRESSINGS) ×1 IMPLANT
GAUZE XEROFORM 1X8 LF (GAUZE/BANDAGES/DRESSINGS) ×1 IMPLANT
GLOVE BIO SURGEON STRL SZ 6 (GLOVE) ×1 IMPLANT
GLOVE BIO SURGEON STRL SZ7.5 (GLOVE) ×1 IMPLANT
GLOVE BIOGEL PI IND STRL 6.5 (GLOVE) ×1 IMPLANT
GLOVE BIOGEL PI IND STRL 8 (GLOVE) ×1 IMPLANT
GOWN STRL REUS W/ TWL LRG LVL3 (GOWN DISPOSABLE) ×2 IMPLANT
GOWN STRL REUS W/ TWL XL LVL3 (GOWN DISPOSABLE) ×1 IMPLANT
KIT STR SPEAR 1.8 FBRTK DISP (KITS) IMPLANT
LASSO 90 CVE QUICKPAS (DISPOSABLE) IMPLANT
LOOP 2 FIBERLINK CLOSED (SUTURE) IMPLANT
MANIFOLD NEPTUNE II (INSTRUMENTS) ×1 IMPLANT
NDL SUT 2-0 SCORPION KNEE (NEEDLE) IMPLANT
NEEDLE SUT 2-0 SCORPION KNEE (NEEDLE) IMPLANT
PACK ARTHROSCOPY DSU (CUSTOM PROCEDURE TRAY) ×1 IMPLANT
PACK BASIN DAY SURGERY FS (CUSTOM PROCEDURE TRAY) ×1 IMPLANT
PAD COLD SHLDR WRAP-ON (PAD) ×1 IMPLANT
PADDING CAST COTTON 6X4 STRL (CAST SUPPLIES) ×1 IMPLANT
PENCIL SMOKE EVACUATOR (MISCELLANEOUS) IMPLANT
SLEEVE SCD COMPRESS KNEE MED (STOCKING) ×1 IMPLANT
SUCTION TUBE FRAZIER 10FR DISP (SUCTIONS) IMPLANT
SUT ETHILON 3 0 PS 1 (SUTURE) ×1 IMPLANT
SUT VIC AB 0 CT1 27XBRD ANBCTR (SUTURE) IMPLANT
SUT VIC AB 2-0 CT1 TAPERPNT 27 (SUTURE) IMPLANT
TOWEL GREEN STERILE FF (TOWEL DISPOSABLE) ×2 IMPLANT
TUBING ARTHROSCOPY IRRIG 16FT (MISCELLANEOUS) ×1 IMPLANT
WAND ABLATOR APOLLO I90 (BUR) ×1 IMPLANT

## 2024-01-31 NOTE — Discharge Instructions (Addendum)
 Discharge Instructions    Attending Surgeon: Elspeth Parker, MD Office Phone Number: [phone]   Diagnosis and Procedures:    Surgeries Performed: Right knee medial meniscal repair  Discharge Plan:    Diet: Resume usual diet. Begin with light or bland foods.  Drink plenty of fluids.  Activity:  Weight bearing as tolerated right leg. You are advised to go home directly from the hospital or surgical center. Restrict your activities.  GENERAL INSTRUCTIONS: 1.  Please apply ice to your wound to help with swelling and inflammation. This will improve your comfort and your overall recovery following surgery.     2. Please call Dr. Danetta office at [phone] with questions Monday-Friday during business hours. If no one answers, please leave a message and someone should get back to the patient within 24 hours. For emergencies please call 911 or proceed to the emergency room.   3. Patient to notify surgical team if experiences any of the following: Bowel/Bladder dysfunction, uncontrolled pain, nerve/muscle weakness, incision with increased drainage or redness, nausea/vomiting and Fever greater than 101.0 F.  Be alert for signs of infection including redness, streaking, odor, fever or chills. Be alert for excessive pain or bleeding and notify your surgeon immediately.  WOUND INSTRUCTIONS:   Leave your dressing, cast, or splint in place until your post operative visit.  Keep it clean and dry.  Always keep the incision clean and dry until the staples/sutures are removed. If there is no drainage from the incision you should keep it open to air. If there is drainage from the incision you must keep it covered at all times until the drainage stops  Do not soak in a bath tub, hot tub, pool, lake or other body of water until 21 days after your surgery and your incision is completely dry and healed.  If you have removable sutures (or staples) they must be removed 10-14 days (unless  otherwise instructed) from the day of your surgery.     1)  Elevate the extremity as much as possible.  2)  Keep the dressing clean and dry.  3)  Please call us  if the dressing becomes wet or dirty.  4)  If you are experiencing worsening pain or worsening swelling, please call.     MEDICATIONS: Resume all previous home medications at the previous prescribed dose and frequency unless otherwise noted Start taking the  pain medications on an as-needed basis as prescribed  Please taper down pain medication over the next week following surgery.  Ideally you should not require a refill of any narcotic pain medication.  Take pain medication with food to minimize nausea. In addition to the prescribed pain medication, you may take over-the-counter pain relievers such as Tylenol .  Do NOT take additional tylenol  if your pain medication already has tylenol  in it.  Aspirin  325mg  daily per instructions on bottle. Narcotic policy: Per [facility] clinic policy, our goal is ensure optimal postoperative pain control with a multimodal pain management strategy. For all [facility] patients, our goal is to wean post-operative narcotic medications by 6 weeks post-operatively, and many times sooner. If this is not possible due to utilization of pain medication prior to surgery, your [facility] doctor will support your acute post-operative pain control for the first 6 weeks postoperatively, with a plan to transition you back to your primary pain team following that. Theresa Rivera will work to ensure a Therapist, occupational.       FOLLOWUP INSTRUCTIONS: 1. Follow up at the Physical  Therapy Clinic 3-4 days following surgery. This appointment should be scheduled unless other arrangements have been made.The Physical Therapy scheduling number is [phone] if an appointment has not already been arranged.  2. Contact Dr. Danetta office during office hours at [phone] or the practice after hours line at [phone] for  non-emergencies. For medical emergencies call 911.   Discharge Location: Home    Post Anesthesia Home Care Instructions  Activity: Get plenty of rest for the remainder of the day. A responsible individual must stay with you for 24 hours following the procedure.  For the next 24 hours, DO NOT: -Drive a car -Advertising copywriter -Drink alcoholic beverages -Take any medication unless instructed by your physician -Make any legal decisions or sign important papers.  Meals: Start with liquid foods such as gelatin or soup. Progress to regular foods as tolerated. Avoid greasy, spicy, heavy foods. If nausea and/or vomiting occur, drink only clear liquids until the nausea and/or vomiting subsides. Call your physician if vomiting continues.  Special Instructions/Symptoms: Your throat may feel dry or sore from the anesthesia or the breathing tube placed in your throat during surgery. If this causes discomfort, gargle with warm salt water. The discomfort should disappear within 24 hours.  If you had a scopolamine patch placed behind your ear for the management of post- operative nausea and/or vomiting:  1. The medication in the patch is effective for 72 hours, after which it should be removed.  Wrap patch in a tissue and discard in the trash. Wash hands thoroughly with soap and water. 2. You may remove the patch earlier than 72 hours if you experience unpleasant side effects which may include dry mouth, dizziness or visual disturbances. 3. Avoid touching the patch. Wash your hands with soap and water after contact with the patch.    No tylenol  until after 4:10pm today. No ibuprofen  until after 7:50pm today.

## 2024-01-31 NOTE — Transfer of Care (Signed)
 Immediate Anesthesia Transfer of Care Note  Patient: Theresa Rivera  Procedure(s) Performed: ARTHROSCOPY, KNEE, WITH MENISCUS REPAIR (Right: Knee)  Patient Location: PACU  Anesthesia Type:General  Level of Consciousness: drowsy and patient cooperative  Airway & Oxygen Therapy: Patient Spontanous Breathing and Patient connected to face mask oxygen  Post-op Assessment: Report given to RN and Post -op Vital signs reviewed and stable  Post vital signs: Reviewed and stable  Last Vitals:  Vitals Value Taken Time  BP 137/86 01/31/24 12:05  Temp    Pulse 72 01/31/24 12:08  Resp 15 01/31/24 12:08  SpO2 100 % 01/31/24 12:08  Vitals shown include unfiled device data.  Last Pain:  Vitals:   01/31/24 1005  TempSrc: Temporal  PainSc: 0-No pain         Complications: No notable events documented.

## 2024-01-31 NOTE — H&P (Signed)
 Chief Complaint: Right knee injury      11/03/2023: Presents today for follow-up of her right knee.  She got approximately 1 week of relief from her injection and is now having persistent pain with activity.  This is limiting her ability to golf   History of Present Illness The patient, with no prior history of knee issues, presents with right knee pain that started a couple of weeks ago. The onset of pain coincided with physically maneuvering her late mother, suggesting a possible injury due to strain or awkward movement. The patient describes a popping sensation in the knee, localized pain at the back and side of the knee, and a feeling of instability, as if the knee is going to buckle. The pain and instability have progressively worsened over the past few weeks, impacting the patient's mobility, particularly when navigating stairs.     Surgical History:   None   PMH/PSH/Family History/Social History/Meds/Allergies:   History reviewed. No pertinent past medical history.     History reviewed. No pertinent surgical history.     Social History         Socioeconomic History   Marital status: Married      Spouse name: Not on file   Number of children: Not on file   Years of education: Not on file   Highest education level: Not on file  Occupational History   Not on file  Tobacco Use   Smoking status: Never   Smokeless tobacco: Never  Substance and Sexual Activity   Alcohol use: Yes      Alcohol/week: 7.0 standard drinks of alcohol      Types: 7 Glasses of wine per week   Drug use: Never   Sexual activity: Yes  Other Topics Concern   Not on file  Social History Narrative   Not on file    Social Drivers of Health    Financial Resource Strain: Not on file  Food Insecurity: Not on file  Transportation Needs: Not on file  Physical Activity: Not on file  Stress: Not on file  Social Connections: Not on file         Family History  Problem Relation  Age of Onset   Prostate cancer Father     Breast cancer Maternal Grandmother 42   Eczema Son          Allergies  No Known Allergies         Current Outpatient Medications  Medication Sig Dispense Refill   acyclovir  (ZOVIRAX ) 200 MG capsule Take 4 capsules (800 mg total) by mouth 2 (two) times daily. Take for 5 days as needed for flares 90 capsule 1               Current Facility-Administered Medications  Medication Dose Route Frequency Provider Last Rate Last Admin   omalizumab  (XOLAIR ) prefilled syringe 300 mg  300 mg Subcutaneous Q21 days Trudy Fusi, DO   300 mg at 03/08/23 1125      Imaging Results (Last 48 hours)  No results found.     Review of Systems:   A ROS was performed including pertinent positives and negatives as documented in the HPI.   Physical Exam :   Constitutional: NAD and appears stated age Neurological: Alert and oriented Psych: Appropriate affect and cooperative There were no vitals taken for this visit.    Comprehensive Musculoskeletal Exam:     Right knee exam demonstrates tenderness with palpation over the medial joint  line.  Active range of motion from 0 to 130 degrees, with pain noted in terminal flexion.  Mild effusion present.  Positive McMurray.  No laxity with varus and valgus stress.   Imaging:   Xray (right knee 4 views): Negative for bony abnormality   MRI right knee: Medial meniscal tear with evidence of extrusion I personally reviewed and interpreted the radiographs.          Assessment & Plan Right knee pain   Acute right knee pain with right knee medial meniscal tear with some extrusion.  Today I did discuss her treatment options.  She has been working on strengthening the knee as well as had an injection.  Given this we did discuss the possibility of arthroscopic surgery.  I discussed the risks and limitations associated with this I discussed the associated recovery time allow for immediate weightbearing.  After discussion  likely she will proceed with right knee arthroscopy with medial meniscal repair  - Plan for likely right knee arthroscopy with medial meniscal repair   After a lengthy discussion of treatment options, including risks, benefits, alternatives, complications of surgical and nonsurgical conservative options, the patient elected surgical repair.   The patient  is aware of the material risks  and complications including, but not limited to injury to adjacent structures, neurovascular injury, infection, numbness, bleeding, implant failure, thermal burns, stiffness, persistent pain, failure to heal, disease transmission from allograft, need for further surgery, dislocation, anesthetic risks, blood clots, risks of death,and others. The probabilities of surgical success and failure discussed with patient given their particular co-morbidities.The time and nature of expected rehabilitation and recovery was discussed.The patient's questions were all answered preoperatively.  No barriers to understanding were noted. I explained the natural history of the disease process and Rx rationale.  I explained to the patient what I considered to be reasonable expectations given their personal situation.  The final treatment plan was arrived at through a shared patient decision making process model.                 I personally saw and evaluated the patient, and participated in the management and treatment plan.

## 2024-01-31 NOTE — Op Note (Signed)
 Date of Surgery: 01/31/2024  INDICATIONS: Theresa Rivera is a 63 y.o.-year-old female with right medial meniscal tear.  The risk and benefits of the procedure were discussed in detail and documented in the pre-operative evaluation.   PREOPERATIVE DIAGNOSIS: 1. Right knee medial meniscal tear  POSTOPERATIVE DIAGNOSIS: Same.  PROCEDURE: 1. Right knee medial repair  SURGEON: Elspeth LITTIE Parker MD  ASSISTANT: Conley Dawson, ATC  ANESTHESIA:  general  IV FLUIDS AND URINE: See anesthesia record.  ANTIBIOTICS: Ancef   ESTIMATED BLOOD LOSS: 10 mL.  IMPLANTS:  Implant Name Type Inv. Item Serial No. Manufacturer Lot No. LRB No. Used Action  ANCHOR SUT 1.8 FIBERTAK SB KL - [id number] Anchor ANCHOR SUT 1.8 FIBERTAK SB KL  [employer] [id number] Right 1 Implanted    DRAINS: None  CULTURES: None  COMPLICATIONS: none  DESCRIPTION OF PROCEDURE:   Examination under anesthesia: A careful examination under anesthesia was performed.  Knee ROM motion was: -3 - 135 Lachman: Normal Pivot Shift: Normal Posterior drawer: normal.   Varus stability in full extension: normal.   Varus stability in 30 degrees of flexion: normal.  Valgus stability in full extension: normal.   Valgus stability in 30 degrees of flexion: normal.  Posterolateral drawer: normal   Intra-operative findings: A thorough arthroscopic examination of the knee was performed.  The findings are: 1. Suprapatellar pouch: Normal 2. Undersurface of median ridge: Normal 3. Medial patellar facet: Normal 4. Lateral patellar facet: Normal 5. Trochlea: Normal 6. Lateral gutter/popliteus tendon: Normal 7. Hoffa's fat pad: Normal 8. Medial gutter/plica: Normal 9. ACL: Normal 10. PCL: Normal 11. Medial meniscus: Normal 12. Medial compartment cartilage: Normal 13. Lateral meniscus: Normal 14. Lateral compartment cartilage: Normal  I identified the patient in the pre-operative holding area.  I marked the operative knee with my initials. I  reviewed the risks and benefits of the proposed surgical intervention and the patient wished to proceed.  Anesthesia performed a peripheral nerve block.  Patient was subsequently taken back to the operating room.  The patient was transferred to the operative suite and placed in the supine position with all bony prominences padded.     SCDs were placed on the non-operative lower extremity. Appropriate antibiotics was administered within 1 hour before incision. The operative lower extremity was then prepped and draped in standard fashion. A time out was performed confirming the correct extremity, correct patient and correct procedure.    At this time the decision was made to perform a meniscal repair/centralization based on the profile of the tear.  The portal was extended proximally.  The drill guide was introduced in the proximal portion of the portal and was drilled just next to the inner portion of the meniscus.  A universal lasso was introduced and wrapped around the meniscocapsular junction.  This was used to feed the passing suture around the meniscocapsular junction.  These were retrieved from the inferior portion of the portal and fed into the knotless mechanism.  This was subsequently tensioned with excellent restoration of the meniscus into the joint.   That concluded the case.  Skin was closed with 2-0 Vicryl and 3-0 nylon. Xeroform gauze, gauze, Tegaderm, Iceman and brace were applied.  Instrument, sponge, and needle counts were correct prior to wound closure and at the conclusion of the case.  The patient was taken to the PACU without complication   POSTOPERATIVE PLAN: She will be weight bearing as tolerated. She will be placed on aspirin  325 for blood clot prevention. She will be seen  by PT.   Elspeth LITTIE Parker, MD 12:05 PM

## 2024-01-31 NOTE — Brief Op Note (Signed)
   Brief Op Note  Date of Surgery: 01/31/2024  Preoperative Diagnosis: RIGHT KNEE MENISCUS TEAR  Postoperative Diagnosis: same  Procedure: Procedure(s): ARTHROSCOPY, KNEE, WITH MENISCUS REPAIR  Implants: Implant Name Type Inv. Item Serial No. Manufacturer Lot No. LRB No. Used Action  ANCHOR SUT 1.8 FIBERTAK SB KL - [id number] Anchor ANCHOR SUT 1.8 [employer] [id number] Right 1 Implanted    Surgeons: Surgeon(s): Genelle Standing, MD  Anesthesia: General    Estimated Blood Loss: See anesthesia record  Complications: None  Condition to PACU: Stable  Standing LITTIE Genelle, MD 01/31/2024 12:05 PM

## 2024-01-31 NOTE — Interval H&P Note (Signed)
 History and Physical Interval Note:  01/31/2024 11:09 AM  Theresa Rivera Alert  has presented today for surgery, with the diagnosis of RIGHT KNEE MENISCUS TEAR.  The various methods of treatment have been discussed with the patient and family. After consideration of risks, benefits and other options for treatment, the patient has consented to  Procedure(s): ARTHROSCOPY, KNEE, WITH MENISCUS REPAIR (Right) as a surgical intervention.  The patient's history has been reviewed, patient examined, no change in status, stable for surgery.  I have reviewed the patient's chart and labs.  Questions were answered to the patient's satisfaction.     Donnell Beauchamp

## 2024-01-31 NOTE — Anesthesia Procedure Notes (Signed)
 Procedure Name: LMA Insertion Date/Time: 01/31/2024 11:22 AM  Performed by: Denton Niels CROME, CRNAPre-anesthesia Checklist: Patient identified, Emergency Drugs available, Suction available, Patient being monitored and Timeout performed Patient Re-evaluated:Patient Re-evaluated prior to induction Oxygen Delivery Method: Circle system utilized Preoxygenation: Pre-oxygenation with 100% oxygen Induction Type: IV induction Ventilation: Mask ventilation without difficulty LMA: LMA inserted LMA Size: 4.0 Number of attempts: 1 Placement Confirmation: positive ETCO2 Dental Injury: Teeth and Oropharynx as per pre-operative assessment

## 2024-01-31 NOTE — Anesthesia Postprocedure Evaluation (Addendum)
 Anesthesia Post Note  Patient: Theresa Rivera  Procedure(s) Performed: ARTHROSCOPY, KNEE, WITH MENISCUS REPAIR (Right: Knee)     Patient location during evaluation: PACU Anesthesia Type: General Level of consciousness: awake and alert and oriented Pain management: pain level controlled Vital Signs Assessment: post-procedure vital signs reviewed and stable Respiratory status: spontaneous breathing, nonlabored ventilation and respiratory function stable Cardiovascular status: blood pressure returned to baseline and stable Postop Assessment: no apparent nausea or vomiting Anesthetic complications: no   No notable events documented.  Last Vitals:  Vitals:   01/31/24 1245 01/31/24 1302  BP: (!) 142/83 (!) 150/80  Pulse: 60 72  Resp: 15 16  Temp:  (!) 36.2 C  SpO2: 100% 94%    Last Pain:  Vitals:   01/31/24 1302  TempSrc: Temporal  PainSc: 0-No pain                 Seven Dollens A.

## 2024-01-31 NOTE — Anesthesia Preprocedure Evaluation (Addendum)
 Anesthesia Evaluation  Patient identified by MRN, date of birth, ID band Patient awake    Reviewed: Allergy  & Precautions, NPO status , Patient's Chart, lab work & pertinent test results  Airway Mallampati: II  TM Distance: >3 FB     Dental no notable dental hx. (+) Teeth Intact, Dental Advisory Given, Caps   Pulmonary neg pulmonary ROS   Pulmonary exam normal breath sounds clear to auscultation       Cardiovascular negative cardio ROS Normal cardiovascular exam Rhythm:Regular Rate:Normal     Neuro/Psych negative neurological ROS  negative psych ROS   GI/Hepatic negative GI ROS, Neg liver ROS,,,  Endo/Other  negative endocrine ROS    Renal/GU negative Renal ROS  negative genitourinary   Musculoskeletal TMM right knee Urticaria ?MCAS   Abdominal   Peds  Hematology negative hematology ROS (+)   Anesthesia Other Findings   Reproductive/Obstetrics                              Anesthesia Physical Anesthesia Plan  ASA: 2  Anesthesia Plan: General   Post-op Pain Management: Minimal or no pain anticipated, Precedex and Tylenol  PO (pre-op)*   Induction: Intravenous  PONV Risk Score and Plan: 4 or greater and Treatment may vary due to age or medical condition, Midazolam , Ondansetron  and Dexamethasone   Airway Management Planned: LMA  Additional Equipment: None  Intra-op Plan:   Post-operative Plan: Extubation in OR  Informed Consent: I have reviewed the patients History and Physical, chart, labs and discussed the procedure including the risks, benefits and alternatives for the proposed anesthesia with the patient or authorized representative who has indicated his/her understanding and acceptance.     Dental advisory given  Plan Discussed with: CRNA and Anesthesiologist  Anesthesia Plan Comments:          Anesthesia Quick Evaluation

## 2024-02-01 ENCOUNTER — Telehealth: Payer: Self-pay | Admitting: Medical Genetics

## 2024-02-01 ENCOUNTER — Encounter (HOSPITAL_BASED_OUTPATIENT_CLINIC_OR_DEPARTMENT_OTHER): Payer: Self-pay | Admitting: Orthopaedic Surgery

## 2024-02-01 NOTE — Telephone Encounter (Signed)
 [facility]  02/01/2024 11:15 AM   Confirmed I was speaking with Theresa Rivera [id number] by using name and DOB. Participant requested to be withdrawn from the program. Informed participant that we will cancel the blood order and process the withdraw request. Participant was thanked for their time and consideration of the above study.

## 2024-02-03 ENCOUNTER — Encounter (HOSPITAL_BASED_OUTPATIENT_CLINIC_OR_DEPARTMENT_OTHER): Payer: Self-pay | Admitting: Physical Therapy

## 2024-02-03 ENCOUNTER — Other Ambulatory Visit: Payer: Self-pay

## 2024-02-03 ENCOUNTER — Ambulatory Visit (HOSPITAL_BASED_OUTPATIENT_CLINIC_OR_DEPARTMENT_OTHER): Attending: Orthopaedic Surgery | Admitting: Physical Therapy

## 2024-02-03 DIAGNOSIS — M25561 Pain in right knee: Secondary | ICD-10-CM | POA: Diagnosis present

## 2024-02-03 DIAGNOSIS — R29898 Other symptoms and signs involving the musculoskeletal system: Secondary | ICD-10-CM | POA: Insufficient documentation

## 2024-02-03 DIAGNOSIS — M25661 Stiffness of right knee, not elsewhere classified: Secondary | ICD-10-CM | POA: Diagnosis present

## 2024-02-03 DIAGNOSIS — R2689 Other abnormalities of gait and mobility: Secondary | ICD-10-CM | POA: Diagnosis present

## 2024-02-03 DIAGNOSIS — S83241A Other tear of medial meniscus, current injury, right knee, initial encounter: Secondary | ICD-10-CM | POA: Insufficient documentation

## 2024-02-03 DIAGNOSIS — M6281 Muscle weakness (generalized): Secondary | ICD-10-CM | POA: Insufficient documentation

## 2024-02-03 NOTE — Therapy (Signed)
 OUTPATIENT PHYSICAL THERAPY LOWER EXTREMITY EVALUATION   Patient Name: Theresa Rivera MRN: [id number] DOB:[birth year], 63 y.o., female Today's Date: 02/03/2024  END OF SESSION:  PT End of Session - 02/03/24 1418     Visit Number 1    Number of Visits 24    Date for PT Re-Evaluation 04/27/24    Authorization Type Medicare    Progress Note Due on Visit 10    PT Start Time 1419    PT Stop Time 1500    PT Time Calculation (min) 41 min    Activity Tolerance Patient tolerated treatment well    Behavior During Therapy [facility] for tasks assessed/performed          History reviewed. No pertinent past medical history. 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   Dyslipidemia 04/18/2021   Other allergic rhinitis 04/12/2013    PCP: Kennyth Worth HERO, MD  REFERRING PROVIDER: Genelle Standing, MD  REFERRING DIAG: [phone] (ICD-10-CM) - Acute medial meniscus tear of right knee, initial encounter; Right knee medial medial repair DOS 01/31/24  THERAPY DIAG:  Right knee pain, unspecified chronicity  Stiffness of right knee, not elsewhere classified  Muscle weakness (generalized)  Other abnormalities of gait and mobility  Other symptoms and signs involving the musculoskeletal system  Rationale for Evaluation and Treatment: Rehabilitation  ONSET DATE: DOS 01/31/24  SUBJECTIVE:   SUBJECTIVE STATEMENT: Patient states finally slept better last night. Loves playing golf but hasn't been able to in the last few months because of this. Golf's right handed.  Active prior to injury.  PERTINENT HISTORY: N/a PAIN:  Are you having pain? Yes: NPRS scale: 2-3/10 Pain location: R knee Pain description: achy/stabbing occasionally Aggravating factors: walking Relieving factors: rest, ibuprofen /tylenol   PRECAUTIONS: Other: Right knee medial medial repai  WEIGHT BEARING RESTRICTIONS: WBAT  FALLS:  Has patient fallen in last 6 months? No   PLOF: Independent  PATIENT GOALS: Get back to golf and play with grand kids  OBJECTIVE: (objective measures from initial evaluation unless otherwise dated)  OBSERVATION:  sutures intact, no s/s of infection  PATIENT SURVEYS:  LEFS  Extreme difficulty/unable (0), Quite a bit of difficulty (1), Moderate difficulty (2), Little difficulty (3), No difficulty (4) Survey date:  02/03/24  Any of your usual work, housework or school activities 1  2. Usual hobbies, recreational or sporting activities 0  3. Getting into/out of the bath 3  4. Walking between rooms 4  5. Putting on socks/shoes 4  6. Squatting  2  7. Lifting an object, like a bag of groceries from the floor 2  8. Performing light activities around your home 2  9. Performing heavy activities around your home 3  10. Getting into/out of a car 4  11. Walking 2 blocks 1  12. Walking 1 mile 0  13. Going up/down 10 stairs (1 flight) 2  14. Standing for 1 hour 3  15.  sitting for 1 hour 4  16. Running on even ground 0  17. Running on uneven ground 0  18. Making sharp turns while running fast  0  19. Hopping  0  20. Rolling over in bed 2  Score total:  34/80     COGNITION: Overall cognitive status: Within functional limits for tasks assessed     SENSATION: WFL  EDEMA:  Gross edema R knee  POSTURE: No Significant postural limitations  PALPATION: Hypomobile patellar glides  LOWER EXTREMITY ROM:  Active ROM Right eval Left eval  Hip flexion    Hip extension    Hip abduction    Hip adduction    Hip internal rotation    Hip external  rotation    Knee flexion 120 >130  Knee extension 0 0  Ankle dorsiflexion    Ankle plantarflexion    Ankle inversion    Ankle eversion     (Blank rows = not tested) *= pain/symptoms  LOWER EXTREMITY MMT: good quad set, very mild extensor lag with reps of SLR  MMT Right eval Left eval  Hip flexion    Hip extension    Hip abduction    Hip adduction    Hip internal rotation    Hip external rotation    Knee flexion    Knee extension    Ankle dorsiflexion    Ankle plantarflexion    Ankle inversion    Ankle eversion     (Blank rows = not tested) *= pain/symptoms   FUNCTIONAL TESTS:  NT due to post op status  GAIT: Distance walked: 100 feet Assistive device utilized: None Level of assistance: Complete Independence Comments: minimally antalgic and minimally lacking flexion/ext during gait cycle   TODAY'S TREATMENT:                                                                                                                              DATE:  02/03/24 Eval, dressing change, education, HEP    PATIENT EDUCATION:  Education details: Patient educated on exam findings, POC, scope of PT, HEP, relevant anatomy and biomechanics, and protocol/precautions. Person educated: Patient Education method: Explanation, Demonstration, and Handouts Education comprehension: verbalized understanding, returned demonstration, verbal cues required, and tactile cues required  HOME EXERCISE PROGRAM: Access Code: [email or web address] URL: [email or web address] Date: 02/03/2024 Prepared by: Prentice Sherby Moncayo  Exercises - Long Sitting Quad Set  - 5 x daily - 7 x weekly - 2 sets - 10 reps - 10 second hold - Supine Heel Slide with Strap  - 5 x daily - 7 x weekly - 10 reps - 10 second hold - Active Straight Leg Raise with Quad Set  - 5 x daily - 7 x weekly - 2 sets - 10 reps - Long Sitting Calf Stretch with Strap  - 1 x daily - 7 x weekly - 3 reps - 20 second hold - Seated Ankle Pumps  - 5 x  daily - 7 x weekly - 20 reps  ASSESSMENT:  CLINICAL IMPRESSION: Patient a 63 y.o. y.o. female who was seen today for physical therapy evaluation and treatment for Right knee  medial medial repair DOS 01/31/24. Patient presents with pain limited deficits in R knee strength, ROM, endurance, activity tolerance, gait, balance, and functional mobility with ADL. Patient is having to modify and restrict ADL as indicated by outcome measure score as well as subjective information and objective measures which is affecting overall participation. Patient will benefit from skilled physical therapy in order to improve function and reduce impairment.  OBJECTIVE IMPAIRMENTS: Abnormal gait, decreased activity tolerance, decreased balance, decreased endurance, decreased mobility, difficulty walking, decreased ROM, decreased strength, increased muscle spasms, impaired flexibility, improper body mechanics, and pain  ACTIVITY LIMITATIONS: lifting, bending, standing, squatting, stairs, transfers, bed mobility, bathing, hygiene/grooming, locomotion level, and caring for others  PARTICIPATION LIMITATIONS: meal prep, cleaning, laundry, driving, shopping, community activity, occupation, and yard work  PERSONAL FACTORS: Time since onset of injury/illness/exacerbation are also affecting patient's functional outcome.   REHAB POTENTIAL: Good  CLINICAL DECISION MAKING: Stable/uncomplicated  EVALUATION COMPLEXITY: Low  GOALS: Goals reviewed with patient? Yes  SHORT TERM GOALS: Target date: 03/16/2024    Patient will be independent with HEP in order to improve functional outcomes. Baseline: Goal status: INITIAL  2.  Patient will report at least 25% improvement in symptoms for improved quality of life. Baseline: Goal status: INITIAL  3.  Patient will demonstrate knee ROM from 0 to 135 for improved mechanics with ADL.  Baseline:  Goal status: INITIAL  4.  Patient able to tolerate basic closed chain exercises  without joint irritation for ability to improve quad strength. Baseline:  Goal status: INITIAL    LONG TERM GOALS: Target date: 04/27/2024    Patient will report at least 75% improvement in symptoms for improved quality of life. Baseline:  Goal status: INITIAL  2.  Patient will improve LEFS score by at least 24 points in order to indicate improved tolerance to activity. Baseline:  Goal status: INITIAL  3.  Patient will be able to perform forward step down test without deviation in order to demonstrate improved LE strength and motor control.  Baseline:  Goal status: INITIAL  4.  Patient will demonstrate MMT 80% of contralateral lower extremity in all tested musculature as evidence of improved strength to assist with stair ambulation and gait. Baseline:  Goal status: INITIAL  5.  Patient will be able to return to all activities unrestricted for improved ability to perform chores and participate with family.  Baseline:  Goal status: INITIAL     PLAN:  PT FREQUENCY: 1-2x/week  PT DURATION: 12 weeks  PLANNED INTERVENTIONS: 97164- PT Re-evaluation, 97110-Therapeutic exercises, 97530- Therapeutic activity, [id number]- Neuromuscular re-education, 97535- Self Care, [id number]- Manual therapy, [id number]- Gait training, [phone]- Orthotic Fit/training, [phone]- Canalith repositioning, [id number]- Aquatic Therapy, [phone]- Splinting, [phone]- Wound care (first 20 sq cm), 97598- Wound care (each additional 20 sq cm)Patient/Family education, Balance training, Stair training, Taping, Dry Needling, Joint mobilization, Joint manipulation, Spinal manipulation, Spinal mobilization, Scar mobilization, and DME instructions.  PLAN FOR NEXT SESSION: progress with mensicus repair protocol   Prentice RAMAN Marilynne Dupuis, PT 02/03/2024, 3:03 PM

## 2024-02-08 ENCOUNTER — Ambulatory Visit (HOSPITAL_BASED_OUTPATIENT_CLINIC_OR_DEPARTMENT_OTHER): Admitting: Physical Therapy

## 2024-02-08 ENCOUNTER — Encounter (HOSPITAL_BASED_OUTPATIENT_CLINIC_OR_DEPARTMENT_OTHER): Payer: Self-pay | Admitting: Physical Therapy

## 2024-02-08 DIAGNOSIS — R29898 Other symptoms and signs involving the musculoskeletal system: Secondary | ICD-10-CM

## 2024-02-08 DIAGNOSIS — M25561 Pain in right knee: Secondary | ICD-10-CM | POA: Diagnosis not present

## 2024-02-08 DIAGNOSIS — M6281 Muscle weakness (generalized): Secondary | ICD-10-CM

## 2024-02-08 DIAGNOSIS — R2689 Other abnormalities of gait and mobility: Secondary | ICD-10-CM

## 2024-02-08 DIAGNOSIS — M25661 Stiffness of right knee, not elsewhere classified: Secondary | ICD-10-CM

## 2024-02-08 NOTE — Therapy (Signed)
 OUTPATIENT PHYSICAL THERAPY TREATMENT   Patient Name: Theresa Rivera MRN: [id number] DOB:[birth year], 63 y.o., female Today's Date: 02/08/2024  END OF SESSION:  PT End of Session - 02/08/24 1424     Visit Number 2    Number of Visits 24    Date for PT Re-Evaluation 04/27/24    Authorization Type Medicare    Progress Note Due on Visit 10    PT Start Time 1424    PT Stop Time 1500    PT Time Calculation (min) 36 min    Activity Tolerance Patient tolerated treatment well    Behavior During Therapy [facility] for tasks assessed/performed          History reviewed. No pertinent past medical history. Past Surgical History:  Procedure Laterality Date   KNEE ARTHROSCOPY WITH MENISCAL REPAIR Right 01/31/2024   Procedure: ARTHROSCOPY, KNEE, WITH MENISCUS REPAIR;  Surgeon: Genelle Standing, MD;  Location: [facility];  Service: Orthopedics;  Laterality: Right;   RHINOPLASTY  1987   Patient Active Problem List   Diagnosis Date Noted   Acute medial meniscus tear of right knee 01/31/2024   Urticaria 05/20/2022   Hyperglycemia 04/20/2022   Cold sore 04/18/2021   Dyslipidemia 04/18/2021   Other allergic rhinitis 04/12/2013    PCP: Kennyth Worth HERO, MD  REFERRING PROVIDER: Genelle Standing, MD  REFERRING DIAG: [phone] (ICD-10-CM) - Acute medial meniscus tear of right knee, initial encounter; Right knee medial medial repair DOS 01/31/24  THERAPY DIAG:  Right knee pain, unspecified chronicity  Stiffness of right knee, not elsewhere classified  Muscle weakness (generalized)  Other abnormalities of gait and mobility  Other symptoms and signs involving the musculoskeletal system  Rationale for Evaluation and Treatment: Rehabilitation  ONSET DATE: DOS 01/31/24  SUBJECTIVE:   SUBJECTIVE STATEMENT: Patient states knee hurts a little at inner stitch. Sleep is getting better.   EVAL: Patient states finally slept better last night. Loves playing golf but hasn't  been able to in the last few months because of this. Golf's right handed. Active prior to injury.  PERTINENT HISTORY: N/a PAIN:  Are you having pain? Yes: NPRS scale: 4/10 Pain location: R knee Pain description: achy/stabbing occasionally Aggravating factors: walking Relieving factors: rest, ibuprofen /tylenol   PRECAUTIONS: Other: Right knee medial medial repai  WEIGHT BEARING RESTRICTIONS: WBAT  FALLS:  Has patient fallen in last 6 months? No   PLOF: Independent  PATIENT GOALS: Get back to golf and play with grand kids  OBJECTIVE: (objective measures from initial evaluation unless otherwise dated)  OBSERVATION:  sutures intact, no s/s of infection  PATIENT SURVEYS:  LEFS  Extreme difficulty/unable (0), Quite a bit of difficulty (1), Moderate difficulty (2), Little difficulty (3), No difficulty (4) Survey date:  02/03/24  Any of your usual work, housework or school activities 1  2. Usual hobbies, recreational or sporting activities 0  3. Getting into/out of the bath 3  4. Walking between rooms 4  5. Putting on socks/shoes 4  6. Squatting  2  7. Lifting an object, like a bag of groceries from the floor 2  8. Performing light activities around your home 2  9. Performing heavy activities around your home 3  10. Getting into/out of a car 4  11. Walking 2 blocks 1  12. Walking 1 mile 0  13. Going up/down 10 stairs (1 flight) 2  14. Standing for 1 hour 3  15.  sitting for 1 hour 4  16. Running on even ground 0  17. Running on uneven ground 0  18. Making sharp turns while running fast 0  19. Hopping  0  20. Rolling over in bed 2  Score total:  34/80     COGNITION: Overall cognitive status: Within functional limits for tasks assessed     SENSATION: WFL  EDEMA:  Gross edema R knee  POSTURE: No Significant postural limitations  PALPATION: Hypomobile patellar glides  LOWER EXTREMITY ROM:  Active ROM Right eval Left eval Right 02/07/24  Hip flexion     Hip  extension     Hip abduction     Hip adduction     Hip internal rotation     Hip external rotation     Knee flexion 120 >130 140  Knee extension 0 0 0  Ankle dorsiflexion     Ankle plantarflexion     Ankle inversion     Ankle eversion      (Blank rows = not tested) *= pain/symptoms  LOWER EXTREMITY MMT: good quad set, very mild extensor lag with reps of SLR  MMT Right eval Left eval  Hip flexion    Hip extension    Hip abduction    Hip adduction    Hip internal rotation    Hip external rotation    Knee flexion    Knee extension    Ankle dorsiflexion    Ankle plantarflexion    Ankle inversion    Ankle eversion     (Blank rows = not tested) *= pain/symptoms   FUNCTIONAL TESTS:  NT due to post op status  GAIT: Distance walked: 100 feet Assistive device utilized: None Level of assistance: Complete Independence Comments: minimally antalgic and minimally lacking flexion/ext during gait cycle   TODAY'S TREATMENT:                                                                                                                              DATE:  02/08/24 SLR 2 x 10 Manual: patellar mobs SAQ 3 x 10 x 5 second holds TKE 10 x 10 second holds Sidelying hip abduction 3 x 10 Prone hip extension 3 x 10   02/03/24 Eval, dressing change, education, HEP    PATIENT EDUCATION:  Education details: Patient educated on exam findings, POC, scope of PT, HEP, relevant anatomy and biomechanics, and protocol/precautions. Person educated: Patient Education method: Explanation, Demonstration, and Handouts Education comprehension: verbalized understanding, returned demonstration, verbal cues required, and tactile cues required  HOME EXERCISE PROGRAM: Access Code: [email or web address] URL: [email or web address] Date: 02/03/2024 Prepared by: Prentice Nyair Depaulo  Exercises - Long Sitting Quad Set  - 5 x daily - 7 x weekly - 2 sets - 10 reps - 10 second hold - Supine Heel Slide with  Strap  - 5 x daily - 7 x weekly - 10 reps - 10 second hold - Active Straight Leg Raise with Quad Set  - 5 x daily - 7 x weekly - 2 sets - 10  reps - Long Sitting Calf Stretch with Strap  - 1 x daily - 7 x weekly - 3 reps - 20 second hold - Seated Ankle Pumps  - 5 x daily - 7 x weekly - 20 reps  ASSESSMENT:  CLINICAL IMPRESSION: Patient with excellent ROM from 0-140. No lag with SLR. Mild/moderate edema greatest at medial  joint line. Dressing changed with no s/s of infection. Began additional quad and glute strengthening which is tolerated well. Patient overall doing very well but did not progress exercise further due to edema present and did not want to avoid further irritation. Patient will continue to benefit from physical therapy in order to improve function and reduce impairment.    OBJECTIVE IMPAIRMENTS: Abnormal gait, decreased activity tolerance, decreased balance, decreased endurance, decreased mobility, difficulty walking, decreased ROM, decreased strength, increased muscle spasms, impaired flexibility, improper body mechanics, and pain  ACTIVITY LIMITATIONS: lifting, bending, standing, squatting, stairs, transfers, bed mobility, bathing, hygiene/grooming, locomotion level, and caring for others  PARTICIPATION LIMITATIONS: meal prep, cleaning, laundry, driving, shopping, community activity, occupation, and yard work  PERSONAL FACTORS: Time since onset of injury/illness/exacerbation are also affecting patient's functional outcome.   REHAB POTENTIAL: Good  CLINICAL DECISION MAKING: Stable/uncomplicated  EVALUATION COMPLEXITY: Low  GOALS: Goals reviewed with patient? Yes  SHORT TERM GOALS: Target date: 03/16/2024    Patient will be independent with HEP in order to improve functional outcomes. Baseline: Goal status: INITIAL  2.  Patient will report at least 25% improvement in symptoms for improved quality of life. Baseline: Goal status: INITIAL  3.  Patient will demonstrate  knee ROM from 0 to 135 for improved mechanics with ADL.  Baseline:  Goal status: INITIAL  4.  Patient able to tolerate basic closed chain exercises without joint irritation for ability to improve quad strength. Baseline:  Goal status: INITIAL    LONG TERM GOALS: Target date: 04/27/2024    Patient will report at least 75% improvement in symptoms for improved quality of life. Baseline:  Goal status: INITIAL  2.  Patient will improve LEFS score by at least 24 points in order to indicate improved tolerance to activity. Baseline:  Goal status: INITIAL  3.  Patient will be able to perform forward step down test without deviation in order to demonstrate improved LE strength and motor control.  Baseline:  Goal status: INITIAL  4.  Patient will demonstrate MMT 80% of contralateral lower extremity in all tested musculature as evidence of improved strength to assist with stair ambulation and gait. Baseline:  Goal status: INITIAL  5.  Patient will be able to return to all activities unrestricted for improved ability to perform chores and participate with family.  Baseline:  Goal status: INITIAL     PLAN:  PT FREQUENCY: 1-2x/week  PT DURATION: 12 weeks  PLANNED INTERVENTIONS: 97164- PT Re-evaluation, 97110-Therapeutic exercises, 97530- Therapeutic activity, [id number]- Neuromuscular re-education, 97535- Self Care, [id number]- Manual therapy, [id number]- Gait training, [phone]- Orthotic Fit/training, [phone]- Canalith repositioning, [id number]- Aquatic Therapy, [phone]- Splinting, [phone]- Wound care (first 20 sq cm), 97598- Wound care (each additional 20 sq cm)Patient/Family education, Balance training, Stair training, Taping, Dry Needling, Joint mobilization, Joint manipulation, Spinal manipulation, Spinal mobilization, Scar mobilization, and DME instructions.  PLAN FOR NEXT SESSION: progress with mensicus repair protocol   Prentice RAMAN Ryett Hamman, PT 02/08/2024, 3:07 PM

## 2024-02-14 ENCOUNTER — Ambulatory Visit (HOSPITAL_BASED_OUTPATIENT_CLINIC_OR_DEPARTMENT_OTHER): Admitting: Orthopaedic Surgery

## 2024-02-14 DIAGNOSIS — S83241A Other tear of medial meniscus, current injury, right knee, initial encounter: Secondary | ICD-10-CM

## 2024-02-14 NOTE — Progress Notes (Signed)
 Post Operative Evaluation    Procedure/Date of Surgery: Right knee medial meniscal repair 8/11  Interval History:    Presents today 2 weeks status post above procedure.  Overall she is doing well.  She is having some periportal nerve type pain with sharpness and stinging   PMH/PSH/Family History/Social History/Meds/Allergies:   No past medical history on file. Past Surgical History:  Procedure Laterality Date   KNEE ARTHROSCOPY WITH MENISCAL REPAIR Right 01/31/2024   Procedure: ARTHROSCOPY, KNEE, WITH MENISCUS REPAIR;  Surgeon: Genelle Standing, MD;  [facility];  Service: Orthopedics;  Laterality: Right;   RHINOPLASTY  1987   Social History   Socioeconomic History   Marital status: Married    Spouse name: Not on file   Number of children: Not on file   Years of education: Not on file   Highest education level: Master's degree (e.g., MA, MS, MEng, MEd, MSW, MBA)  Occupational History   Not on file  Tobacco Use   Smoking status: Never   Smokeless tobacco: Never  Vaping Use   Vaping status: Never Used  Substance and Sexual Activity   Alcohol use: Yes    Alcohol/week: 7.0 standard drinks of alcohol    Types: 7 Glasses of wine per week    Comment: occasional   Drug use: Never   Sexual activity: Yes  Other Topics Concern   Not on file  Social History Narrative   Not on file   Social Drivers of Health   Financial Resource Strain: Low Risk  (01/24/2024)   Overall Financial Resource Strain (CARDIA)    Difficulty of Paying Living Expenses: Not hard at all  Food Insecurity: No Food Insecurity (01/24/2024)   Hunger Vital Sign    Worried About Running Out of Food in the Last Year: Never true    Ran Out of Food in the Last Year: Never true  Transportation Needs: No Transportation Needs (01/24/2024)   PRAPARE - Administrator, Civil Service (Medical): No    Lack of Transportation (Non-Medical): No  Physical  Activity: Unknown (01/24/2024)   Exercise Vital Sign    Days of Exercise per Week: Patient declined    Minutes of Exercise per Session: Not on file  Stress: No Stress Concern Present (01/24/2024)   [employer] of Occupational Health - Occupational Stress Questionnaire    Feeling of Stress: Not at all  Social Connections: Socially Integrated (01/24/2024)   Social Connection and Isolation Panel    Frequency of Communication with Friends and Family: More than three times a week    Frequency of Social Gatherings with Friends and Family: More than three times a week    Attends Religious Services: More than 4 times per year    Active Member of [employer] or Organizations: Yes    Attends Engineer, structural: More than 4 times per year    Marital Status: Married   Family History  Problem Relation Age of Onset   Prostate cancer Father    Breast cancer Maternal Grandmother 44   Eczema Son    No Known Allergies Current Outpatient Medications  Medication Sig Dispense Refill   acyclovir  (ZOVIRAX ) 200 MG capsule Take 4 capsules (800 mg total) by mouth 2 (two) times daily. Take for 5 days as needed for flares 90 capsule  1   aspirin  EC 325 MG tablet Take 1 tablet (325 mg total) by mouth daily. 14 tablet 0   oxyCODONE  (ROXICODONE ) 5 MG immediate release tablet Take 1 tablet (5 mg total) by mouth every 4 (four) hours as needed for severe pain (pain score 7-10) or breakthrough pain. 10 tablet 0   No current facility-administered medications for this visit.   No results found.  Review of Systems:   A ROS was performed including pertinent positives and negatives as documented in the HPI.   Musculoskeletal Exam:    There were no vitals taken for this visit.  Right knee incisions are well-appearing without erythema or drainage.  Range of motion is from 0 to 120 degrees.  There is some periportal tenderness  Imaging:      I personally reviewed and interpreted the  radiographs.   Assessment:   2 weeks status post right knee medial meniscal repair with some evidence of periportal pain and nerve related pain.  At this time I did counsel her on a topical lidocaine  preparations which she can utilize.  I will plan to see her back in 4 weeks for reassessment  Plan :    - Return to clinic 4 weeks for reassessment      I personally saw and evaluated the patient, and participated in the management and treatment plan.  Elspeth Parker, MD Attending Physician, Orthopedic Surgery  This document was dictated using Dragon voice recognition software. A reasonable attempt at proof reading has been made to minimize errors.

## 2024-02-17 ENCOUNTER — Ambulatory Visit (HOSPITAL_BASED_OUTPATIENT_CLINIC_OR_DEPARTMENT_OTHER): Admitting: Physical Therapy

## 2024-02-17 ENCOUNTER — Encounter (HOSPITAL_BASED_OUTPATIENT_CLINIC_OR_DEPARTMENT_OTHER): Payer: Self-pay | Admitting: Physical Therapy

## 2024-02-17 DIAGNOSIS — M25561 Pain in right knee: Secondary | ICD-10-CM | POA: Diagnosis not present

## 2024-02-17 DIAGNOSIS — M25661 Stiffness of right knee, not elsewhere classified: Secondary | ICD-10-CM

## 2024-02-17 NOTE — Therapy (Signed)
 OUTPATIENT PHYSICAL THERAPY TREATMENT   Patient Name: Theresa Rivera MRN: [id number] DOB:1960/12/22, 63 y.o., female Today's Date: 02/17/2024  END OF SESSION:  PT End of Session - 02/17/24 1101     Visit Number 3    Number of Visits 24    Date for PT Re-Evaluation 04/27/24    Authorization Type Medicare    Progress Note Due on Visit 10    PT Start Time 1100    PT Stop Time 1141    PT Time Calculation (min) 41 min    Activity Tolerance Patient tolerated treatment well    Behavior During Therapy [facility] for tasks assessed/performed           History reviewed. No pertinent past medical history. Past Surgical History:  Procedure Laterality Date   KNEE ARTHROSCOPY WITH MENISCAL REPAIR Right 01/31/2024   Procedure: ARTHROSCOPY, KNEE, WITH MENISCUS REPAIR;  Surgeon: Genelle Standing, MD;  Location: [facility];  Service: Orthopedics;  Laterality: Right;   RHINOPLASTY  1987   Patient Active Problem List   Diagnosis Date Noted   Acute medial meniscus tear of right knee 01/31/2024   Urticaria 05/20/2022   Hyperglycemia 04/20/2022   Cold sore 04/18/2021   Dyslipidemia 04/18/2021   Other allergic rhinitis 04/12/2013    PCP: Kennyth Worth HERO, MD  REFERRING PROVIDER: Genelle Standing, MD  REFERRING DIAG: [phone] (ICD-10-CM) - Acute medial meniscus tear of right knee, initial encounter; Right knee medial medial repair DOS 01/31/24  THERAPY DIAG:  Right knee pain, unspecified chronicity  Stiffness of right knee, not elsewhere classified  Rationale for Evaluation and Treatment: Rehabilitation  ONSET DATE: DOS 01/31/24  SUBJECTIVE:   SUBJECTIVE STATEMENT: Ice 3-4/day. I can do ADLs, but not golfing.   EVAL: Patient states finally slept better last night. Loves playing golf but hasn't been able to in the last few months because of this. Golf's right handed. Active prior to injury.  PERTINENT HISTORY: N/a PAIN:  Are you having pain? Yes: NPRS scale:  4/10 Pain location: R knee Pain description: achy/stabbing occasionally Aggravating factors: walking Relieving factors: rest, ibuprofen /tylenol   PRECAUTIONS: Other: Right knee medial medial repai  WEIGHT BEARING RESTRICTIONS: WBAT  FALLS:  Has patient fallen in last 6 months? No   PLOF: Independent  PATIENT GOALS: Get back to golf and play with grand kids  OBJECTIVE: (objective measures from initial evaluation unless otherwise dated)  OBSERVATION:  sutures intact, no s/s of infection  PATIENT SURVEYS:  LEFS  Extreme difficulty/unable (0), Quite a bit of difficulty (1), Moderate difficulty (2), Little difficulty (3), No difficulty (4) Survey date:  02/03/24  Any of your usual work, housework or school activities 1  2. Usual hobbies, recreational or sporting activities 0  3. Getting into/out of the bath 3  4. Walking between rooms 4  5. Putting on socks/shoes 4  6. Squatting  2  7. Lifting an object, like a bag of groceries from the floor 2  8. Performing light activities around your home 2  9. Performing heavy activities around your home 3  10. Getting into/out of a car 4  11. Walking 2 blocks 1  12. Walking 1 mile 0  13. Going up/down 10 stairs (1 flight) 2  14. Standing for 1 hour 3  15.  sitting for 1 hour 4  16. Running on even ground 0  17. Running on uneven ground 0  18. Making sharp turns while running fast 0  19. Hopping  0  20. Rolling  over in bed 2  Score total:  34/80     COGNITION: Overall cognitive status: Within functional limits for tasks assessed     SENSATION: WFL  EDEMA:  Gross edema R knee  POSTURE: No Significant postural limitations  PALPATION: Hypomobile patellar glides  LOWER EXTREMITY ROM:  Active ROM Right eval Left eval Right 02/07/24  Hip flexion     Hip extension     Hip abduction     Hip adduction     Hip internal rotation     Hip external rotation     Knee flexion 120 >130 140  Knee extension 0 0 0  Ankle  dorsiflexion     Ankle plantarflexion     Ankle inversion     Ankle eversion      (Blank rows = not tested) *= pain/symptoms  LOWER EXTREMITY MMT: good quad set, very mild extensor lag with reps of SLR  MMT Right eval Left eval  Hip flexion    Hip extension    Hip abduction    Hip adduction    Hip internal rotation    Hip external rotation    Knee flexion    Knee extension    Ankle dorsiflexion    Ankle plantarflexion    Ankle inversion    Ankle eversion     (Blank rows = not tested) *= pain/symptoms   FUNCTIONAL TESTS:  NT due to post op status  GAIT: Distance walked: 100 feet Assistive device utilized: None Level of assistance: Complete Independence Comments: minimally antalgic and minimally lacking flexion/ext during gait cycle   TODAY'S TREATMENT:                                                                                                                              DATE:  8/28 Edema mobilization anterior and post aspect of kee Patellar mobs- focus on inf glide Scar mobs Gait- quad set at heel strike, glut set at stance phase to toe off SLS with hip hike for CKC glut med engagement Dead lift to mid-shin, cues to avoid squatting motion through knees, just slightly bent.   02/08/24 SLR 2 x 10 Manual: patellar mobs SAQ 3 x 10 x 5 second holds TKE 10 x 10 second holds Sidelying hip abduction 3 x 10 Prone hip extension 3 x 10   02/03/24 Eval, dressing change, education, HEP    PATIENT EDUCATION:  Education details: Patient educated on exam findings, POC, scope of PT, HEP, relevant anatomy and biomechanics, and protocol/precautions. Person educated: Patient Education method: Explanation, Demonstration, and Handouts Education comprehension: verbalized understanding, returned demonstration, verbal cues required, and tactile cues required  HOME EXERCISE PROGRAM: Access Code: [email or web address] URL: [email or web address] Date: 02/03/2024 Prepared  by: Prentice Zaunegger  Exercises - Long Sitting Quad Set  - 5 x daily - 7 x weekly - 2 sets - 10 reps - 10 second hold - Supine Heel Slide with Strap  - 5 x daily -  7 x weekly - 10 reps - 10 second hold - Active Straight Leg Raise with Quad Set  - 5 x daily - 7 x weekly - 2 sets - 10 reps - Long Sitting Calf Stretch with Strap  - 1 x daily - 7 x weekly - 3 reps - 20 second hold - Seated Ankle Pumps  - 5 x daily - 7 x weekly - 20 reps  ASSESSMENT:  CLINICAL IMPRESSION: Decreased concordant pain with mobilization of edema. Tendency to lack full knee ext and hip ext in gait and corrected with VC. Encouraged use of compression sleeve to knee to control swelling.     OBJECTIVE IMPAIRMENTS: Abnormal gait, decreased activity tolerance, decreased balance, decreased endurance, decreased mobility, difficulty walking, decreased ROM, decreased strength, increased muscle spasms, impaired flexibility, improper body mechanics, and pain  ACTIVITY LIMITATIONS: lifting, bending, standing, squatting, stairs, transfers, bed mobility, bathing, hygiene/grooming, locomotion level, and caring for others  PARTICIPATION LIMITATIONS: meal prep, cleaning, laundry, driving, shopping, community activity, occupation, and yard work  PERSONAL FACTORS: Time since onset of injury/illness/exacerbation are also affecting patient's functional outcome.   REHAB POTENTIAL: Good  CLINICAL DECISION MAKING: Stable/uncomplicated  EVALUATION COMPLEXITY: Low  GOALS: Goals reviewed with patient? Yes  SHORT TERM GOALS: Target date: 03/16/2024    Patient will be independent with HEP in order to improve functional outcomes. Baseline: Goal status: INITIAL  2.  Patient will report at least 25% improvement in symptoms for improved quality of life. Baseline: Goal status: INITIAL  3.  Patient will demonstrate knee ROM from 0 to 135 for improved mechanics with ADL.  Baseline:  Goal status: INITIAL  4.  Patient able to tolerate  basic closed chain exercises without joint irritation for ability to improve quad strength. Baseline:  Goal status: INITIAL    LONG TERM GOALS: Target date: 04/27/2024    Patient will report at least 75% improvement in symptoms for improved quality of life. Baseline:  Goal status: INITIAL  2.  Patient will improve LEFS score by at least 24 points in order to indicate improved tolerance to activity. Baseline:  Goal status: INITIAL  3.  Patient will be able to perform forward step down test without deviation in order to demonstrate improved LE strength and motor control.  Baseline:  Goal status: INITIAL  4.  Patient will demonstrate MMT 80% of contralateral lower extremity in all tested musculature as evidence of improved strength to assist with stair ambulation and gait. Baseline:  Goal status: INITIAL  5.  Patient will be able to return to all activities unrestricted for improved ability to perform chores and participate with family.  Baseline:  Goal status: INITIAL     PLAN:  PT FREQUENCY: 1-2x/week  PT DURATION: 12 weeks  PLANNED INTERVENTIONS: 97164- PT Re-evaluation, 97110-Therapeutic exercises, 97530- Therapeutic activity, [id number]- Neuromuscular re-education, 97535- Self Care, 02859- Manual therapy, [id number]- Gait training, [phone]- Orthotic Fit/training, [phone]- Canalith repositioning, [id number]- Aquatic Therapy, [phone]- Splinting, [phone]- Wound care (first 20 sq cm), 97598- Wound care (each additional 20 sq cm)Patient/Family education, Balance training, Stair training, Taping, Dry Needling, Joint mobilization, Joint manipulation, Spinal manipulation, Spinal mobilization, Scar mobilization, and DME instructions.  PLAN FOR NEXT SESSION: progress with mensicus repair protocol   Harlene Cordon, PT, DPT 02/17/2024, 11:52 AM

## 2024-02-22 ENCOUNTER — Encounter (HOSPITAL_BASED_OUTPATIENT_CLINIC_OR_DEPARTMENT_OTHER): Payer: Self-pay

## 2024-02-22 ENCOUNTER — Ambulatory Visit (HOSPITAL_BASED_OUTPATIENT_CLINIC_OR_DEPARTMENT_OTHER): Attending: Orthopaedic Surgery

## 2024-02-22 DIAGNOSIS — M25561 Pain in right knee: Secondary | ICD-10-CM | POA: Diagnosis present

## 2024-02-22 DIAGNOSIS — M6281 Muscle weakness (generalized): Secondary | ICD-10-CM | POA: Insufficient documentation

## 2024-02-22 DIAGNOSIS — R29898 Other symptoms and signs involving the musculoskeletal system: Secondary | ICD-10-CM | POA: Diagnosis present

## 2024-02-22 DIAGNOSIS — M25661 Stiffness of right knee, not elsewhere classified: Secondary | ICD-10-CM | POA: Insufficient documentation

## 2024-02-22 DIAGNOSIS — R2689 Other abnormalities of gait and mobility: Secondary | ICD-10-CM | POA: Insufficient documentation

## 2024-02-22 NOTE — Therapy (Signed)
 OUTPATIENT PHYSICAL THERAPY TREATMENT   Patient Name: Theresa Rivera MRN: [id number] DOB:23-Nov-1960, 63 y.o., female Today's Date: 02/22/2024  END OF SESSION:  PT End of Session - 02/22/24 1353     Visit Number 4    Number of Visits 24    Date for PT Re-Evaluation 04/27/24    Authorization Type Medicare    Progress Note Due on Visit 10    PT Start Time 1351    PT Stop Time 1430    PT Time Calculation (min) 39 min    Activity Tolerance Patient tolerated treatment well    Behavior During Therapy [facility] for tasks assessed/performed            History reviewed. No pertinent past medical history. Past Surgical History:  Procedure Laterality Date   KNEE ARTHROSCOPY WITH MENISCAL REPAIR Right 01/31/2024   Procedure: ARTHROSCOPY, KNEE, WITH MENISCUS REPAIR;  Surgeon: Genelle Standing, MD;  Location: [facility];  Service: Orthopedics;  Laterality: Right;   RHINOPLASTY  1987   Patient Active Problem List   Diagnosis Date Noted   Acute medial meniscus tear of right knee 01/31/2024   Urticaria 05/20/2022   Hyperglycemia 04/20/2022   Cold sore 04/18/2021   Dyslipidemia 04/18/2021   Other allergic rhinitis 04/12/2013    PCP: Kennyth Worth HERO, MD  REFERRING PROVIDER: Genelle Standing, MD  REFERRING DIAG: [phone] (ICD-10-CM) - Acute medial meniscus tear of right knee, initial encounter; Right knee medial medial repair DOS 01/31/24  THERAPY DIAG:  Right knee pain, unspecified chronicity  Stiffness of right knee, not elsewhere classified  Muscle weakness (generalized)  Other abnormalities of gait and mobility  Other symptoms and signs involving the musculoskeletal system  Rationale for Evaluation and Treatment: Rehabilitation  ONSET DATE: DOS 01/31/24  SUBJECTIVE:   SUBJECTIVE STATEMENT: Pt reports swelling is improved, but pain is unchanged. Describes a sharp pain n medial knee that occurs mostly with weightbearing activity.   EVAL: Patient states  finally slept better last night. Loves playing golf but hasn't been able to in the last few months because of this. Golf's right handed. Active prior to injury.  PERTINENT HISTORY: N/a PAIN:  Are you having pain? Yes: NPRS scale: 4/10 Pain location: R knee Pain description: achy/stabbing occasionally Aggravating factors: walking Relieving factors: rest, ibuprofen /tylenol   PRECAUTIONS: Other: Right knee medial medial repai  WEIGHT BEARING RESTRICTIONS: WBAT  FALLS:  Has patient fallen in last 6 months? No   PLOF: Independent  PATIENT GOALS: Get back to golf and play with grand kids  OBJECTIVE: (objective measures from initial evaluation unless otherwise dated)  OBSERVATION:  sutures intact, no s/s of infection  PATIENT SURVEYS:  LEFS  Extreme difficulty/unable (0), Quite a bit of difficulty (1), Moderate difficulty (2), Little difficulty (3), No difficulty (4) Survey date:  02/03/24  Any of your usual work, housework or school activities 1  2. Usual hobbies, recreational or sporting activities 0  3. Getting into/out of the bath 3  4. Walking between rooms 4  5. Putting on socks/shoes 4  6. Squatting  2  7. Lifting an object, like a bag of groceries from the floor 2  8. Performing light activities around your home 2  9. Performing heavy activities around your home 3  10. Getting into/out of a car 4  11. Walking 2 blocks 1  12. Walking 1 mile 0  13. Going up/down 10 stairs (1 flight) 2  14. Standing for 1 hour 3  15.  sitting for  1 hour 4  16. Running on even ground 0  17. Running on uneven ground 0  18. Making sharp turns while running fast 0  19. Hopping  0  20. Rolling over in bed 2  Score total:  34/80     COGNITION: Overall cognitive status: Within functional limits for tasks assessed     SENSATION: WFL  EDEMA:  Gross edema R knee  POSTURE: No Significant postural limitations  PALPATION: Hypomobile patellar glides  LOWER EXTREMITY ROM:  Active  ROM Right eval Left eval Right 02/07/24  Hip flexion     Hip extension     Hip abduction     Hip adduction     Hip internal rotation     Hip external rotation     Knee flexion 120 >130 140  Knee extension 0 0 0  Ankle dorsiflexion     Ankle plantarflexion     Ankle inversion     Ankle eversion      (Blank rows = not tested) *= pain/symptoms  LOWER EXTREMITY MMT: good quad set, very mild extensor lag with reps of SLR  MMT Right eval Left eval  Hip flexion    Hip extension    Hip abduction    Hip adduction    Hip internal rotation    Hip external rotation    Knee flexion    Knee extension    Ankle dorsiflexion    Ankle plantarflexion    Ankle inversion    Ankle eversion     (Blank rows = not tested) *= pain/symptoms   FUNCTIONAL TESTS:  NT due to post op status  GAIT: Distance walked: 100 feet Assistive device utilized: None Level of assistance: Complete Independence Comments: minimally antalgic and minimally lacking flexion/ext during gait cycle   TODAY'S TREATMENT:                                                                                                                              DATE:    02/22/24 PROM R knee STM R adductors and medial HS Scar mobilizations Patella mobilizations Supine SLR neutral 2x10, with ER 2x10 Sidelying hip abduction 2x15bil Prone hip extension 2x10ea Bridges 2x10 Standing HR 2x10 Standing marching x10 Partial squats 2x10 Recumbent bike L3 x39min Gastroc stretching in standing Gait in hall x177ft   8/28 Edema mobilization anterior and post aspect of kee Patellar mobs- focus on inf glide Scar mobs Gait- quad set at heel strike, glut set at stance phase to toe off SLS with hip hike for CKC glut med engagement Dead lift to mid-shin, cues to avoid squatting motion through knees, just slightly bent.      PATIENT EDUCATION:  Education details: Patient educated on exam findings, POC, scope of PT, HEP, relevant  anatomy and biomechanics, and protocol/precautions. Person educated: Patient Education method: Explanation, Demonstration, and Handouts Education comprehension: verbalized understanding, returned demonstration, verbal cues required, and tactile cues required  HOME EXERCISE PROGRAM:  Access Code: [email or web address] URL: [email or web address] Date: 02/03/2024 Prepared by: Prentice Zaunegger  Exercises - Long Sitting Quad Set  - 5 x daily - 7 x weekly - 2 sets - 10 reps - 10 second hold - Supine Heel Slide with Strap  - 5 x daily - 7 x weekly - 10 reps - 10 second hold - Active Straight Leg Raise with Quad Set  - 5 x daily - 7 x weekly - 2 sets - 10 reps - Long Sitting Calf Stretch with Strap  - 1 x daily - 7 x weekly - 3 reps - 20 second hold - Seated Ankle Pumps  - 5 x daily - 7 x weekly - 20 reps  ASSESSMENT:  CLINICAL IMPRESSION: Improving swelling compared to previous session. Minimal to no restrictions with knee PROM. Tender to palpation of medial aspect of knee as well as into distal adductors and medial HS. Pt also tender into proximal/medial gastroc/post tib. Instructed pt in self mobilization for these areas. Pt able to trial recumbent bike in clinic without c/o pain. Discussed precautions/restrictions at this healing phase. Will continue to progress as tolerated.     OBJECTIVE IMPAIRMENTS: Abnormal gait, decreased activity tolerance, decreased balance, decreased endurance, decreased mobility, difficulty walking, decreased ROM, decreased strength, increased muscle spasms, impaired flexibility, improper body mechanics, and pain  ACTIVITY LIMITATIONS: lifting, bending, standing, squatting, stairs, transfers, bed mobility, bathing, hygiene/grooming, locomotion level, and caring for others  PARTICIPATION LIMITATIONS: meal prep, cleaning, laundry, driving, shopping, community activity, occupation, and yard work  PERSONAL FACTORS: Time since onset of injury/illness/exacerbation are  also affecting patient's functional outcome.   REHAB POTENTIAL: Good  CLINICAL DECISION MAKING: Stable/uncomplicated  EVALUATION COMPLEXITY: Low  GOALS: Goals reviewed with patient? Yes  SHORT TERM GOALS: Target date: 03/16/2024    Patient will be independent with HEP in order to improve functional outcomes. Baseline: Goal status: INITIAL  2.  Patient will report at least 25% improvement in symptoms for improved quality of life. Baseline: Goal status: INITIAL  3.  Patient will demonstrate knee ROM from 0 to 135 for improved mechanics with ADL.  Baseline:  Goal status: INITIAL  4.  Patient able to tolerate basic closed chain exercises without joint irritation for ability to improve quad strength. Baseline:  Goal status: INITIAL    LONG TERM GOALS: Target date: 04/27/2024    Patient will report at least 75% improvement in symptoms for improved quality of life. Baseline:  Goal status: INITIAL  2.  Patient will improve LEFS score by at least 24 points in order to indicate improved tolerance to activity. Baseline:  Goal status: INITIAL  3.  Patient will be able to perform forward step down test without deviation in order to demonstrate improved LE strength and motor control.  Baseline:  Goal status: INITIAL  4.  Patient will demonstrate MMT 80% of contralateral lower extremity in all tested musculature as evidence of improved strength to assist with stair ambulation and gait. Baseline:  Goal status: INITIAL  5.  Patient will be able to return to all activities unrestricted for improved ability to perform chores and participate with family.  Baseline:  Goal status: INITIAL     PLAN:  PT FREQUENCY: 1-2x/week  PT DURATION: 12 weeks  PLANNED INTERVENTIONS: 97164- PT Re-evaluation, 97110-Therapeutic exercises, 97530- Therapeutic activity, [id number]- Neuromuscular re-education, 97535- Self Care, 02859- Manual therapy, [id number]- Gait training, [phone]- Orthotic Fit/training,  [id number]- Canalith repositioning, [id number]- Aquatic Therapy, [id number]- Splinting, [id number]- Wound care (first 20 sq cm), 02401- Wound  care (each additional 20 sq cm)Patient/Family education, Balance training, Stair training, Taping, Dry Needling, Joint mobilization, Joint manipulation, Spinal manipulation, Spinal mobilization, Scar mobilization, and DME instructions.  PLAN FOR NEXT SESSION: progress with mensicus repair protocol   Asberry BRAVO Fathima Bartl, PTA 02/22/2024, 3:08 PM

## 2024-02-29 ENCOUNTER — Encounter (HOSPITAL_BASED_OUTPATIENT_CLINIC_OR_DEPARTMENT_OTHER): Payer: Self-pay | Admitting: Physical Therapy

## 2024-02-29 ENCOUNTER — Ambulatory Visit (HOSPITAL_BASED_OUTPATIENT_CLINIC_OR_DEPARTMENT_OTHER): Admitting: Physical Therapy

## 2024-02-29 DIAGNOSIS — M6281 Muscle weakness (generalized): Secondary | ICD-10-CM

## 2024-02-29 DIAGNOSIS — R2689 Other abnormalities of gait and mobility: Secondary | ICD-10-CM

## 2024-02-29 DIAGNOSIS — M25561 Pain in right knee: Secondary | ICD-10-CM

## 2024-02-29 DIAGNOSIS — M25661 Stiffness of right knee, not elsewhere classified: Secondary | ICD-10-CM

## 2024-02-29 DIAGNOSIS — R29898 Other symptoms and signs involving the musculoskeletal system: Secondary | ICD-10-CM

## 2024-02-29 NOTE — Therapy (Signed)
 OUTPATIENT PHYSICAL THERAPY TREATMENT   Patient Name: Ruthel Martine MRN: [id number] DOB:January 19, 1961, 63 y.o., female Today's Date: 02/29/2024  END OF SESSION:  PT End of Session - 02/29/24 1055     Visit Number 5    Number of Visits 24    Date for PT Re-Evaluation 04/27/24    Authorization Type Medicare    Progress Note Due on Visit 10    PT Start Time 1016    PT Stop Time 1055    PT Time Calculation (min) 39 min    Activity Tolerance Patient tolerated treatment well    Behavior During Therapy [facility] for tasks assessed/performed             History reviewed. No pertinent past medical history. Past Surgical History:  Procedure Laterality Date   KNEE ARTHROSCOPY WITH MENISCAL REPAIR Right 01/31/2024   Procedure: ARTHROSCOPY, KNEE, WITH MENISCUS REPAIR;  Surgeon: Genelle Standing, MD;  Location: [facility];  Service: Orthopedics;  Laterality: Right;   RHINOPLASTY  1987   Patient Active Problem List   Diagnosis Date Noted   Acute medial meniscus tear of right knee 01/31/2024   Urticaria 05/20/2022   Hyperglycemia 04/20/2022   Cold sore 04/18/2021   Dyslipidemia 04/18/2021   Other allergic rhinitis 04/12/2013    PCP: Kennyth Worth HERO, MD  REFERRING PROVIDER: Genelle Standing, MD  REFERRING DIAG: [phone] (ICD-10-CM) - Acute medial meniscus tear of right knee, initial encounter; Right knee medial medial repair DOS 01/31/24  THERAPY DIAG:  Right knee pain, unspecified chronicity  Stiffness of right knee, not elsewhere classified  Muscle weakness (generalized)  Other abnormalities of gait and mobility  Other symptoms and signs involving the musculoskeletal system  Rationale for Evaluation and Treatment: Rehabilitation  ONSET DATE: DOS 01/31/24  SUBJECTIVE:   SUBJECTIVE STATEMENT:   I'm ready to get my money back, it feels worse than it did before surgery. It constantly throbs or has shooting pains. Not sleeping at night. I felt like I was  improving but it really started feeling bad this weekend, started walking a little more this weekend- did a mile one day, skipped a day, then did another mile the day after that.    EVAL: Patient states finally slept better last night. Loves playing golf but hasn't been able to in the last few months because of this. Golf's right handed. Active prior to injury.  PERTINENT HISTORY: N/a PAIN:  Are you having pain? Yes: NPRS scale: 2/10 Pain location: R knee Pain description: achy/stabbing/throbbing  Aggravating factors: walking, being up on it/WB  Relieving factors: rest, ibuprofen /tylenol , ice   PRECAUTIONS: Other: Right knee medial medial repai  WEIGHT BEARING RESTRICTIONS: WBAT  FALLS:  Has patient fallen in last 6 months? No   PLOF: Independent  PATIENT GOALS: Get back to golf and play with grand kids  OBJECTIVE: (objective measures from initial evaluation unless otherwise dated)  OBSERVATION:  sutures intact, no s/s of infection  PATIENT SURVEYS:  LEFS  Extreme difficulty/unable (0), Quite a bit of difficulty (1), Moderate difficulty (2), Little difficulty (3), No difficulty (4) Survey date:  02/03/24  Any of your usual work, housework or school activities 1  2. Usual hobbies, recreational or sporting activities 0  3. Getting into/out of the bath 3  4. Walking between rooms 4  5. Putting on socks/shoes 4  6. Squatting  2  7. Lifting an object, like a bag of groceries from the floor 2  8. Performing light activities around your  home 2  9. Performing heavy activities around your home 3  10. Getting into/out of a car 4  11. Walking 2 blocks 1  12. Walking 1 mile 0  13. Going up/down 10 stairs (1 flight) 2  14. Standing for 1 hour 3  15.  sitting for 1 hour 4  16. Running on even ground 0  17. Running on uneven ground 0  18. Making sharp turns while running fast 0  19. Hopping  0  20. Rolling over in bed 2  Score total:  34/80     COGNITION: Overall cognitive  status: Within functional limits for tasks assessed     SENSATION: WFL  EDEMA:  Gross edema R knee  POSTURE: No Significant postural limitations  PALPATION: Hypomobile patellar glides  LOWER EXTREMITY ROM:  Active ROM Right eval Left eval Right 02/07/24 Right 02/29/24  Hip flexion      Hip extension      Hip abduction      Hip adduction      Hip internal rotation      Hip external rotation      Knee flexion 120 >130 140 Seated 135*  Knee extension 0 0 0 Seated 11*, supine 2*  Ankle dorsiflexion      Ankle plantarflexion      Ankle inversion      Ankle eversion       (Blank rows = not tested) *= pain/symptoms  LOWER EXTREMITY MMT: good quad set, very mild extensor lag with reps of SLR  MMT Right eval Right 02/29/24  Hip flexion    Hip extension    Hip abduction    Hip adduction    Hip internal rotation    Hip external rotation    Knee flexion  4  Knee extension  4- some medial knee pain   Ankle dorsiflexion    Ankle plantarflexion    Ankle inversion    Ankle eversion     (Blank rows = not tested) *= pain/symptoms   FUNCTIONAL TESTS:  NT due to post op status  GAIT: Distance walked: 100 feet Assistive device utilized: None Level of assistance: Complete Independence Comments: minimally antalgic and minimally lacking flexion/ext during gait cycle   TODAY'S TREATMENT:                                                                                                                              DATE:    02/29/24  Scifit bike L4 x8 minutes seat 8 for w/u ROM, goals   Education on activity modification and tapering- let knee pain settle and then ok to return to walking but try 1/2 mile first and see how it feels. If more pain, drop to 1/4 mile, if no pain stay at 1/2 mile for a few days and can then progress 1/4 mile at a time  SLRs 1.5# 2x10 SLRs + ER 2x10 B Sidelying hip ABD 1.5# B 2x10  Prone  hip extensions 1.5# 2x10  Bridge + ABD into red TB x12   Shuttle LE press BLEs 75# 2x12, single leg 37# 2x12 B (no knee flexion over 90* as a precaution to avoid excessive stress on repair) STS red TB above knees x10 Standing hip ABD x10 B red TB  Mini-squats red TB above knees x12  Hip hikes x12 B Hip hikes + swing x8 B (pain limited) HS stretch R 2x30 seconds Quad/hip flexor stretch R 2x30 seconds    02/22/24 PROM R knee STM R adductors and medial HS Scar mobilizations Patella mobilizations Supine SLR neutral 2x10, with ER 2x10 Sidelying hip abduction 2x15bil Prone hip extension 2x10ea Bridges 2x10 Standing HR 2x10 Standing marching x10 Partial squats 2x10 Recumbent bike L3 x93min Gastroc stretching in standing Gait in hall x173ft   8/28 Edema mobilization anterior and post aspect of kee Patellar mobs- focus on inf glide Scar mobs Gait- quad set at heel strike, glut set at stance phase to toe off SLS with hip hike for CKC glut med engagement Dead lift to mid-shin, cues to avoid squatting motion through knees, just slightly bent.      PATIENT EDUCATION:  Education details: Patient educated on exam findings, POC, scope of PT, HEP, relevant anatomy and biomechanics, and protocol/precautions. Person educated: Patient Education method: Explanation, Demonstration, and Handouts Education comprehension: verbalized understanding, returned demonstration, verbal cues required, and tactile cues required  HOME EXERCISE PROGRAM: Access Code: [email or web address] URL: [email or web address] Date: 02/03/2024 Prepared by: Prentice Zaunegger  Exercises - Long Sitting Quad Set  - 5 x daily - 7 x weekly - 2 sets - 10 reps - 10 second hold - Supine Heel Slide with Strap  - 5 x daily - 7 x weekly - 10 reps - 10 second hold - Active Straight Leg Raise with Quad Set  - 5 x daily - 7 x weekly - 2 sets - 10 reps - Long Sitting Calf Stretch with Strap  - 1 x daily - 7 x weekly - 3 reps - 20 second hold - Seated Ankle Pumps  - 5 x daily - 7 x  weekly - 20 reps  ASSESSMENT:  CLINICAL IMPRESSION:  Arrives not feeling great today- still having a lot of knee pain. She started a walking program and went a mile to start with, this may have been a bit much. Encouraged activity tapering and modification based on pain levels, then focused on ROM and strength as tolerated this morning. Will continue to challenge her as appropriate.      OBJECTIVE IMPAIRMENTS: Abnormal gait, decreased activity tolerance, decreased balance, decreased endurance, decreased mobility, difficulty walking, decreased ROM, decreased strength, increased muscle spasms, impaired flexibility, improper body mechanics, and pain  ACTIVITY LIMITATIONS: lifting, bending, standing, squatting, stairs, transfers, bed mobility, bathing, hygiene/grooming, locomotion level, and caring for others  PARTICIPATION LIMITATIONS: meal prep, cleaning, laundry, driving, shopping, community activity, occupation, and yard work  PERSONAL FACTORS: Time since onset of injury/illness/exacerbation are also affecting patient's functional outcome.   REHAB POTENTIAL: Good  CLINICAL DECISION MAKING: Stable/uncomplicated  EVALUATION COMPLEXITY: Low  GOALS: Goals reviewed with patient? Yes  SHORT TERM GOALS: Target date: 03/16/2024    Patient will be independent with HEP in order to improve functional outcomes. Baseline: Goal status: MET 02/29/24  2.  Patient will report at least 25% improvement in symptoms for improved quality of life. Baseline: Goal status: IN PROGRESS 02/29/24  3.  Patient will demonstrate knee ROM from 0 to 135 for improved mechanics with  ADL.  Baseline:  Goal status: MET 02/29/24  4.  Patient able to tolerate basic closed chain exercises without joint irritation for ability to improve quad strength. Baseline:  Goal status: IN PROGRESS 02/29/24    LONG TERM GOALS: Target date: 04/27/2024    Patient will report at least 75% improvement in symptoms for improved quality  of life. Baseline:  Goal status: INITIAL  2.  Patient will improve LEFS score by at least 24 points in order to indicate improved tolerance to activity. Baseline:  Goal status: INITIAL  3.  Patient will be able to perform forward step down test without deviation in order to demonstrate improved LE strength and motor control.  Baseline:  Goal status: INITIAL  4.  Patient will demonstrate MMT 80% of contralateral lower extremity in all tested musculature as evidence of improved strength to assist with stair ambulation and gait. Baseline:  Goal status: INITIAL  5.  Patient will be able to return to all activities unrestricted for improved ability to perform chores and participate with family.  Baseline:  Goal status: INITIAL     PLAN:  PT FREQUENCY: 1-2x/week  PT DURATION: 12 weeks  PLANNED INTERVENTIONS: 97164- PT Re-evaluation, 97110-Therapeutic exercises, 97530- Therapeutic activity, [id number]- Neuromuscular re-education, 97535- Self Care, 02859- Manual therapy, [id number]- Gait training, [phone]- Orthotic Fit/training, [phone]- Canalith repositioning, [id number]- Aquatic Therapy, [phone]- Splinting, [phone]- Wound care (first 20 sq cm), 97598- Wound care (each additional 20 sq cm)Patient/Family education, Balance training, Stair training, Taping, Dry Needling, Joint mobilization, Joint manipulation, Spinal manipulation, Spinal mobilization, Scar mobilization, and DME instructions.  PLAN FOR NEXT SESSION: progress with mensicus repair protocol, 4 weeks out as of 02/28/24  Sees MD 03/17/24   Josette Rough, PT, DPT 02/29/24 10:56 AM

## 2024-03-02 ENCOUNTER — Ambulatory Visit (HOSPITAL_BASED_OUTPATIENT_CLINIC_OR_DEPARTMENT_OTHER)

## 2024-03-02 ENCOUNTER — Encounter (HOSPITAL_BASED_OUTPATIENT_CLINIC_OR_DEPARTMENT_OTHER): Payer: Self-pay

## 2024-03-02 DIAGNOSIS — R2689 Other abnormalities of gait and mobility: Secondary | ICD-10-CM

## 2024-03-02 DIAGNOSIS — M25661 Stiffness of right knee, not elsewhere classified: Secondary | ICD-10-CM

## 2024-03-02 DIAGNOSIS — R29898 Other symptoms and signs involving the musculoskeletal system: Secondary | ICD-10-CM

## 2024-03-02 DIAGNOSIS — M25561 Pain in right knee: Secondary | ICD-10-CM

## 2024-03-02 DIAGNOSIS — M6281 Muscle weakness (generalized): Secondary | ICD-10-CM

## 2024-03-02 NOTE — Therapy (Addendum)
 OUTPATIENT PHYSICAL THERAPY TREATMENT   Patient Name: Dafne Nield MRN: [id number] DOB:[birth year], 63 y.o., female Today's Date: 03/02/2024  END OF SESSION:  PT End of Session - 03/02/24 1115     Visit Number 6    Number of Visits 24    Date for PT Re-Evaluation 04/27/24    Authorization Type Medicare    Progress Note Due on Visit 10    PT Start Time 1016    PT Stop Time 1100    PT Time Calculation (min) 44 min    Activity Tolerance Patient tolerated treatment well    Behavior During Therapy [facility] for tasks assessed/performed              History reviewed. No pertinent past medical history. Past Surgical History:  Procedure Laterality Date   KNEE ARTHROSCOPY WITH MENISCAL REPAIR Right 01/31/2024   Procedure: ARTHROSCOPY, KNEE, WITH MENISCUS REPAIR;  Surgeon: Genelle Standing, MD;  Location: [facility];  Service: Orthopedics;  Laterality: Right;   RHINOPLASTY  1987   Patient Active Problem List   Diagnosis Date Noted   Acute medial meniscus tear of right knee 01/31/2024   Urticaria 05/20/2022   Hyperglycemia 04/20/2022   Cold sore 04/18/2021   Dyslipidemia 04/18/2021   Other allergic rhinitis 04/12/2013    PCP: Kennyth Worth HERO, MD  REFERRING PROVIDER: Genelle Standing, MD  REFERRING DIAG: [phone] (ICD-10-CM) - Acute medial meniscus tear of right knee, initial encounter; Right knee medial medial repair DOS 01/31/24  THERAPY DIAG:  Right knee pain, unspecified chronicity  Stiffness of right knee, not elsewhere classified  Muscle weakness (generalized)  Other abnormalities of gait and mobility  Other symptoms and signs involving the musculoskeletal system  Rationale for Evaluation and Treatment: Rehabilitation  ONSET DATE: DOS 01/31/24  SUBJECTIVE:   SUBJECTIVE STATEMENT:   Pt now 4 weeks s/p. Pt reports ongoing pain, especially at nighttime. Points to medial location.    EVAL: Patient states finally slept better last night.  Loves playing golf but hasn't been able to in the last few months because of this. Golf's right handed. Active prior to injury.  PERTINENT HISTORY: N/a PAIN:  Are you having pain? Yes: NPRS scale: 2/10 at rest, 4/10 with walking Pain location: R knee Pain description: achy/stabbing/throbbing  Aggravating factors: walking, being up on it/WB  Relieving factors: rest, ibuprofen /tylenol , ice   PRECAUTIONS: Other: Right knee medial medial repai  WEIGHT BEARING RESTRICTIONS: WBAT  FALLS:  Has patient fallen in last 6 months? No   PLOF: Independent  PATIENT GOALS: Get back to golf and play with grand kids  OBJECTIVE: (objective measures from initial evaluation unless otherwise dated)  OBSERVATION:  sutures intact, no s/s of infection  PATIENT SURVEYS:  LEFS  Extreme difficulty/unable (0), Quite a bit of difficulty (1), Moderate difficulty (2), Little difficulty (3), No difficulty (4) Survey date:  02/03/24  Any of your usual work, housework or school activities 1  2. Usual hobbies, recreational or sporting activities 0  3. Getting into/out of the bath 3  4. Walking between rooms 4  5. Putting on socks/shoes 4  6. Squatting  2  7. Lifting an object, like a bag of groceries from the floor 2  8. Performing light activities around your home 2  9. Performing heavy activities around your home 3  10. Getting into/out of a car 4  11. Walking 2 blocks 1  12. Walking 1 mile 0  13. Going up/down 10 stairs (1 flight) 2  14. Standing for 1 hour 3  15.  sitting for 1 hour 4  16. Running on even ground 0  17. Running on uneven ground 0  18. Making sharp turns while running fast 0  19. Hopping  0  20. Rolling over in bed 2  Score total:  34/80     COGNITION: Overall cognitive status: Within functional limits for tasks assessed     SENSATION: WFL  EDEMA:  Gross edema R knee  POSTURE: No Significant postural limitations  PALPATION: Hypomobile patellar glides  LOWER EXTREMITY  ROM:  Active ROM Right eval Left eval Right 02/07/24 Right 02/29/24  Hip flexion      Hip extension      Hip abduction      Hip adduction      Hip internal rotation      Hip external rotation      Knee flexion 120 >130 140 Seated 135*  Knee extension 0 0 0 Seated 11*, supine 2*  Ankle dorsiflexion      Ankle plantarflexion      Ankle inversion      Ankle eversion       (Blank rows = not tested) *= pain/symptoms  LOWER EXTREMITY MMT: good quad set, very mild extensor lag with reps of SLR  MMT Right eval Right 02/29/24  Hip flexion    Hip extension    Hip abduction    Hip adduction    Hip internal rotation    Hip external rotation    Knee flexion  4  Knee extension  4- some medial knee pain   Ankle dorsiflexion    Ankle plantarflexion    Ankle inversion    Ankle eversion     (Blank rows = not tested) *= pain/symptoms   FUNCTIONAL TESTS:  NT due to post op status  GAIT: Distance walked: 100 feet Assistive device utilized: None Level of assistance: Complete Independence Comments: minimally antalgic and minimally lacking flexion/ext during gait cycle   TODAY'S TREATMENT:                                                                                                                              DATE:    03/02/24 PROM R knee STM to adductors/medial HS Patella mobilizations SLR x10  Bridge 5 2x15 S/ hip abduction 2x15bil Prone hip extension with knee flexed 2x10ea Staggered sit to stands 2x10 R posterior from elevated plinth 2x10 Side stepping in hall with GTB at thighs-cues for glute med activation and hip orientation Sci fit bike L4 x72min     02/29/24  Scifit bike L4 x8 minutes seat 8 for w/u ROM, goals   Education on activity modification and tapering- let knee pain settle and then ok to return to walking but try 1/2 mile first and see how it feels. If more pain, drop to 1/4 mile, if no pain stay at 1/2 mile for a few days and can then progress 1/4  mile  at a time  SLRs 1.5# 2x10 SLRs + ER 2x10 B Sidelying hip ABD 1.5# B 2x10  Prone hip extensions 1.5# 2x10  Bridge + ABD into red TB x12  Shuttle LE press BLEs 75# 2x12, single leg 37# 2x12 B (no knee flexion over 90* as a precaution to avoid excessive stress on repair) STS red TB above knees x10 Standing hip ABD x10 B red TB  Mini-squats red TB above knees x12  Hip hikes x12 B Hip hikes + swing x8 B (pain limited) HS stretch R 2x30 seconds Quad/hip flexor stretch R 2x30 seconds    02/22/24 PROM R knee STM R adductors and medial HS Scar mobilizations Patella mobilizations Supine SLR neutral 2x10, with ER 2x10 Sidelying hip abduction 2x15bil Prone hip extension 2x10ea Bridges 2x10 Standing HR 2x10 Standing marching x10 Partial squats 2x10 Recumbent bike L3 x61min Gastroc stretching in standing Gait in hall x145ft   8/28 Edema mobilization anterior and post aspect of kee Patellar mobs- focus on inf glide Scar mobs Gait- quad set at heel strike, glut set at stance phase to toe off SLS with hip hike for CKC glut med engagement Dead lift to mid-shin, cues to avoid squatting motion through knees, just slightly bent.      PATIENT EDUCATION:  Education details: Patient educated on exam findings, POC, scope of PT, HEP, relevant anatomy and biomechanics, and protocol/precautions. Person educated: Patient Education method: Explanation, Demonstration, and Handouts Education comprehension: verbalized understanding, returned demonstration, verbal cues required, and tactile cues required  HOME EXERCISE PROGRAM: Access Code: [email or web address] URL: [email or web address] Date: 02/03/2024 Prepared by: Prentice Zaunegger  Exercises - Long Sitting Quad Set  - 5 x daily - 7 x weekly - 2 sets - 10 reps - 10 second hold - Supine Heel Slide with Strap  - 5 x daily - 7 x weekly - 10 reps - 10 second hold - Active Straight Leg Raise with Quad Set  - 5 x daily - 7 x weekly - 2 sets - 10  reps - Long Sitting Calf Stretch with Strap  - 1 x daily - 7 x weekly - 3 reps - 20 second hold - Seated Ankle Pumps  - 5 x daily - 7 x weekly - 20 reps  ASSESSMENT:  CLINICAL IMPRESSION:  Discussed healing timeline and expectations. Observed greater weakness in R glutes with prone hip extension compared to L. Will continue to work on hip strengthening and NMR focus. Imporving TKE with mild deficit observed. Pt remains tender in pes anserine area. Instructed in self IASTM using massager to adductor mm as well as quads. Able to perform SLR with minimal lag, though did have inferior patella discomfort with initial 2 reps. Decreased closed chain exercises today and instructed pt to limit these until next session to see if this improves her pain level. Will continue to progress as tolerated.      OBJECTIVE IMPAIRMENTS: Abnormal gait, decreased activity tolerance, decreased balance, decreased endurance, decreased mobility, difficulty walking, decreased ROM, decreased strength, increased muscle spasms, impaired flexibility, improper body mechanics, and pain  ACTIVITY LIMITATIONS: lifting, bending, standing, squatting, stairs, transfers, bed mobility, bathing, hygiene/grooming, locomotion level, and caring for others  PARTICIPATION LIMITATIONS: meal prep, cleaning, laundry, driving, shopping, community activity, occupation, and yard work  PERSONAL FACTORS: Time since onset of injury/illness/exacerbation are also affecting patient's functional outcome.   REHAB POTENTIAL: Good  CLINICAL DECISION MAKING: Stable/uncomplicated  EVALUATION COMPLEXITY: Low  GOALS: Goals reviewed with patient? Yes  SHORT TERM  GOALS: Target date: 03/16/2024    Patient will be independent with HEP in order to improve functional outcomes. Baseline: Goal status: MET 02/29/24  2.  Patient will report at least 25% improvement in symptoms for improved quality of life. Baseline: Goal status: IN PROGRESS 02/29/24  3.   Patient will demonstrate knee ROM from 0 to 135 for improved mechanics with ADL.  Baseline:  Goal status: MET 02/29/24  4.  Patient able to tolerate basic closed chain exercises without joint irritation for ability to improve quad strength. Baseline:  Goal status: IN PROGRESS 02/29/24    LONG TERM GOALS: Target date: 04/27/2024    Patient will report at least 75% improvement in symptoms for improved quality of life. Baseline:  Goal status: INITIAL  2.  Patient will improve LEFS score by at least 24 points in order to indicate improved tolerance to activity. Baseline:  Goal status: INITIAL  3.  Patient will be able to perform forward step down test without deviation in order to demonstrate improved LE strength and motor control.  Baseline:  Goal status: INITIAL  4.  Patient will demonstrate MMT 80% of contralateral lower extremity in all tested musculature as evidence of improved strength to assist with stair ambulation and gait. Baseline:  Goal status: INITIAL  5.  Patient will be able to return to all activities unrestricted for improved ability to perform chores and participate with family.  Baseline:  Goal status: INITIAL     PLAN:  PT FREQUENCY: 1-2x/week  PT DURATION: 12 weeks  PLANNED INTERVENTIONS: 97164- PT Re-evaluation, 97110-Therapeutic exercises, 97530- Therapeutic activity, [id number]- Neuromuscular re-education, 97535- Self Care, [id number]- Manual therapy, [id number]- Gait training, [phone]- Orthotic Fit/training, [phone]- Canalith repositioning, [id number]- Aquatic Therapy, [phone]- Splinting, [phone]- Wound care (first 20 sq cm), 97598- Wound care (each additional 20 sq cm)Patient/Family education, Balance training, Stair training, Taping, Dry Needling, Joint mobilization, Joint manipulation, Spinal manipulation, Spinal mobilization, Scar mobilization, and DME instructions.  PLAN FOR NEXT SESSION: progress with mensicus repair protocol, 4 weeks out as of 02/28/24  Sees MD  03/17/24   Asberry Rodes, PTA  03/02/24 11:17 AM

## 2024-03-07 ENCOUNTER — Ambulatory Visit (HOSPITAL_BASED_OUTPATIENT_CLINIC_OR_DEPARTMENT_OTHER)

## 2024-03-07 ENCOUNTER — Encounter (HOSPITAL_BASED_OUTPATIENT_CLINIC_OR_DEPARTMENT_OTHER): Payer: Self-pay

## 2024-03-07 DIAGNOSIS — M25661 Stiffness of right knee, not elsewhere classified: Secondary | ICD-10-CM

## 2024-03-07 DIAGNOSIS — M25561 Pain in right knee: Secondary | ICD-10-CM

## 2024-03-07 DIAGNOSIS — M6281 Muscle weakness (generalized): Secondary | ICD-10-CM

## 2024-03-07 DIAGNOSIS — R29898 Other symptoms and signs involving the musculoskeletal system: Secondary | ICD-10-CM

## 2024-03-07 DIAGNOSIS — R2689 Other abnormalities of gait and mobility: Secondary | ICD-10-CM

## 2024-03-07 NOTE — Therapy (Signed)
 OUTPATIENT PHYSICAL THERAPY TREATMENT   Patient Name: Theresa Rivera MRN: [id number] DOB:1961/06/18, 63 y.o., female Today's Date: 03/07/2024  END OF SESSION:  PT End of Session - 03/07/24 0924     Visit Number 7    Number of Visits 24    Date for PT Re-Evaluation 04/27/24    Authorization Type Medicare    Progress Note Due on Visit 10    PT Start Time 0922    PT Stop Time 1004    PT Time Calculation (min) 42 min    Activity Tolerance Patient tolerated treatment well    Behavior During Therapy [facility] for tasks assessed/performed               History reviewed. No pertinent past medical history. Past Surgical History:  Procedure Laterality Date   KNEE ARTHROSCOPY WITH MENISCAL REPAIR Right 01/31/2024   Procedure: ARTHROSCOPY, KNEE, WITH MENISCUS REPAIR;  Surgeon: Genelle Standing, MD;  Location: [facility];  Service: Orthopedics;  Laterality: Right;   RHINOPLASTY  1987   Patient Active Problem List   Diagnosis Date Noted   Acute medial meniscus tear of right knee 01/31/2024   Urticaria 05/20/2022   Hyperglycemia 04/20/2022   Cold sore 04/18/2021   Dyslipidemia 04/18/2021   Other allergic rhinitis 04/12/2013    PCP: Kennyth Worth HERO, MD  REFERRING PROVIDER: Genelle Standing, MD  REFERRING DIAG: [phone] (ICD-10-CM) - Acute medial meniscus tear of right knee, initial encounter; Right knee medial medial repair DOS 01/31/24  THERAPY DIAG:  Right knee pain, unspecified chronicity  Stiffness of right knee, not elsewhere classified  Muscle weakness (generalized)  Other abnormalities of gait and mobility  Other symptoms and signs involving the musculoskeletal system  Rationale for Evaluation and Treatment: Rehabilitation  ONSET DATE: DOS 01/31/24  SUBJECTIVE:   SUBJECTIVE STATEMENT:   Pt reports feeling great after last PT session for that day, but then had increased pain the next day. She has been trying to take it easier. 2/10 pain  level at entry.   EVAL: Patient states finally slept better last night. Loves playing golf but hasn't been able to in the last few months because of this. Golf's right handed. Active prior to injury.  PERTINENT HISTORY: N/a PAIN:  Are you having pain? Yes: NPRS scale: 2/10 at rest, 4/10 with walking Pain location: R knee Pain description: achy/stabbing/throbbing  Aggravating factors: walking, being up on it/WB  Relieving factors: rest, ibuprofen /tylenol , ice   PRECAUTIONS: Other: Right knee medial medial repai  WEIGHT BEARING RESTRICTIONS: WBAT  FALLS:  Has patient fallen in last 6 months? No   PLOF: Independent  PATIENT GOALS: Get back to golf and play with grand kids  OBJECTIVE: (objective measures from initial evaluation unless otherwise dated)  OBSERVATION:  sutures intact, no s/s of infection  PATIENT SURVEYS:  LEFS  Extreme difficulty/unable (0), Quite a bit of difficulty (1), Moderate difficulty (2), Little difficulty (3), No difficulty (4) Survey date:  02/03/24  Any of your usual work, housework or school activities 1  2. Usual hobbies, recreational or sporting activities 0  3. Getting into/out of the bath 3  4. Walking between rooms 4  5. Putting on socks/shoes 4  6. Squatting  2  7. Lifting an object, like a bag of groceries from the floor 2  8. Performing light activities around your home 2  9. Performing heavy activities around your home 3  10. Getting into/out of a car 4  11. Walking 2 blocks 1  12. Walking 1 mile 0  13. Going up/down 10 stairs (1 flight) 2  14. Standing for 1 hour 3  15.  sitting for 1 hour 4  16. Running on even ground 0  17. Running on uneven ground 0  18. Making sharp turns while running fast 0  19. Hopping  0  20. Rolling over in bed 2  Score total:  34/80     COGNITION: Overall cognitive status: Within functional limits for tasks assessed     SENSATION: WFL  EDEMA:  Gross edema R knee  POSTURE: No Significant  postural limitations  PALPATION: Hypomobile patellar glides  LOWER EXTREMITY ROM:  Active ROM Right eval Left eval Right 02/07/24 Right 02/29/24  Hip flexion      Hip extension      Hip abduction      Hip adduction      Hip internal rotation      Hip external rotation      Knee flexion 120 >130 140 Seated 135*  Knee extension 0 0 0 Seated 11*, supine 2*  Ankle dorsiflexion      Ankle plantarflexion      Ankle inversion      Ankle eversion       (Blank rows = not tested) *= pain/symptoms  LOWER EXTREMITY MMT: good quad set, very mild extensor lag with reps of SLR  MMT Right eval Right 02/29/24  Hip flexion    Hip extension    Hip abduction    Hip adduction    Hip internal rotation    Hip external rotation    Knee flexion  4  Knee extension  4- some medial knee pain   Ankle dorsiflexion    Ankle plantarflexion    Ankle inversion    Ankle eversion     (Blank rows = not tested) *= pain/symptoms   FUNCTIONAL TESTS:  NT due to post op status  GAIT: Distance walked: 100 feet Assistive device utilized: None Level of assistance: Complete Independence Comments: minimally antalgic and minimally lacking flexion/ext during gait cycle   TODAY'S TREATMENT:                                                                                                                              DATE:   03/07/24 Sci fit bike L4 x63min PROM R knee STM to adductors/medial HS SLR 2x10  Bridge 5 2x15 S/ hip abduction 2x15bil Prone hip extension with knee flexed 2x10ea LAQ with VMO sqz 2.5# 3 3x10 Sit to stands 2x10 R posterior from elevated plinth 2x10 Shuttle press single leg 75# 3x10ea Side stepping in hall with GTB at thighs-cues for glute med activation and hip orientation x4laps at rail Single HR 2x10ea Standing HSC 2.5# 2x10   03/02/24 PROM R knee STM to adductors/medial HS Patella mobilizations SLR x10  Bridge 5 2x15 S/ hip abduction 2x15bil Prone hip extension with knee  flexed 2x10ea Staggered sit to stands 2x10  R posterior from elevated plinth 2x10 Side stepping in hall with GTB at thighs-cues for glute med activation and hip orientation Sci fit bike L4 x67min     02/29/24  Scifit bike L4 x8 minutes seat 8 for w/u ROM, goals   Education on activity modification and tapering- let knee pain settle and then ok to return to walking but try 1/2 mile first and see how it feels. If more pain, drop to 1/4 mile, if no pain stay at 1/2 mile for a few days and can then progress 1/4 mile at a time  SLRs 1.5# 2x10 SLRs + ER 2x10 B Sidelying hip ABD 1.5# B 2x10  Prone hip extensions 1.5# 2x10  Bridge + ABD into red TB x12  Shuttle LE press BLEs 75# 2x12, single leg 37# 2x12 B (no knee flexion over 90* as a precaution to avoid excessive stress on repair) STS red TB above knees x10 Standing hip ABD x10 B red TB  Mini-squats red TB above knees x12  Hip hikes x12 B Hip hikes + swing x8 B (pain limited) HS stretch R 2x30 seconds Quad/hip flexor stretch R 2x30 seconds     PATIENT EDUCATION:  Education details: Patient educated on exam findings, POC, scope of PT, HEP, relevant anatomy and biomechanics, and protocol/precautions. Person educated: Patient Education method: Explanation, Demonstration, and Handouts Education comprehension: verbalized understanding, returned demonstration, verbal cues required, and tactile cues required  HOME EXERCISE PROGRAM: Access Code: [email or web address] URL: [email or web address] Date: 02/03/2024 Prepared by: Prentice Zaunegger  Exercises - Long Sitting Quad Set  - 5 x daily - 7 x weekly - 2 sets - 10 reps - 10 second hold - Supine Heel Slide with Strap  - 5 x daily - 7 x weekly - 10 reps - 10 second hold - Active Straight Leg Raise with Quad Set  - 5 x daily - 7 x weekly - 2 sets - 10 reps - Long Sitting Calf Stretch with Strap  - 1 x daily - 7 x weekly - 3 reps - 20 second hold - Seated Ankle Pumps  - 5 x daily - 7 x  weekly - 20 reps  ASSESSMENT:  CLINICAL IMPRESSION:  Continued with focus on open chain and gentle closed chain activities. Pt with decreased tenderness over medial aspect of knee and associated musculature. Mild fatigue noted with lateral hip strengthening. Began open chain HS curls without complaint. Instructed pt to continue decreasing her activity level as this has seemed to help her pain level over the weekend. Will monitor pain level and progress as tolerated.      OBJECTIVE IMPAIRMENTS: Abnormal gait, decreased activity tolerance, decreased balance, decreased endurance, decreased mobility, difficulty walking, decreased ROM, decreased strength, increased muscle spasms, impaired flexibility, improper body mechanics, and pain  ACTIVITY LIMITATIONS: lifting, bending, standing, squatting, stairs, transfers, bed mobility, bathing, hygiene/grooming, locomotion level, and caring for others  PARTICIPATION LIMITATIONS: meal prep, cleaning, laundry, driving, shopping, community activity, occupation, and yard work  PERSONAL FACTORS: Time since onset of injury/illness/exacerbation are also affecting patient's functional outcome.   REHAB POTENTIAL: Good  CLINICAL DECISION MAKING: Stable/uncomplicated  EVALUATION COMPLEXITY: Low  GOALS: Goals reviewed with patient? Yes  SHORT TERM GOALS: Target date: 03/16/2024    Patient will be independent with HEP in order to improve functional outcomes. Baseline: Goal status: MET 02/29/24  2.  Patient will report at least 25% improvement in symptoms for improved quality of life. Baseline: Goal status: IN PROGRESS 02/29/24  3.  Patient will  demonstrate knee ROM from 0 to 135 for improved mechanics with ADL.  Baseline:  Goal status: MET 02/29/24  4.  Patient able to tolerate basic closed chain exercises without joint irritation for ability to improve quad strength. Baseline:  Goal status: IN PROGRESS 02/29/24    LONG TERM GOALS: Target date:  04/27/2024    Patient will report at least 75% improvement in symptoms for improved quality of life. Baseline:  Goal status: INITIAL  2.  Patient will improve LEFS score by at least 24 points in order to indicate improved tolerance to activity. Baseline:  Goal status: INITIAL  3.  Patient will be able to perform forward step down test without deviation in order to demonstrate improved LE strength and motor control.  Baseline:  Goal status: INITIAL  4.  Patient will demonstrate MMT 80% of contralateral lower extremity in all tested musculature as evidence of improved strength to assist with stair ambulation and gait. Baseline:  Goal status: INITIAL  5.  Patient will be able to return to all activities unrestricted for improved ability to perform chores and participate with family.  Baseline:  Goal status: INITIAL     PLAN:  PT FREQUENCY: 1-2x/week  PT DURATION: 12 weeks  PLANNED INTERVENTIONS: 97164- PT Re-evaluation, 97110-Therapeutic exercises, 97530- Therapeutic activity, [id number]- Neuromuscular re-education, 97535- Self Care, 02859- Manual therapy, [id number]- Gait training, [phone]- Orthotic Fit/training, [phone]- Canalith repositioning, [id number]- Aquatic Therapy, [phone]- Splinting, [phone]- Wound care (first 20 sq cm), 97598- Wound care (each additional 20 sq cm)Patient/Family education, Balance training, Stair training, Taping, Dry Needling, Joint mobilization, Joint manipulation, Spinal manipulation, Spinal mobilization, Scar mobilization, and DME instructions.  PLAN FOR NEXT SESSION: progress with mensicus repair protocol, 4 weeks out as of 02/28/24  Sees MD 03/17/24   Asberry Rodes, PTA  03/07/24 10:11 AM

## 2024-03-09 ENCOUNTER — Encounter (HOSPITAL_BASED_OUTPATIENT_CLINIC_OR_DEPARTMENT_OTHER): Admitting: Physical Therapy

## 2024-03-10 ENCOUNTER — Ambulatory Visit (HOSPITAL_BASED_OUTPATIENT_CLINIC_OR_DEPARTMENT_OTHER): Admitting: Physical Therapy

## 2024-03-10 ENCOUNTER — Encounter (HOSPITAL_BASED_OUTPATIENT_CLINIC_OR_DEPARTMENT_OTHER): Payer: Self-pay | Admitting: Physical Therapy

## 2024-03-10 DIAGNOSIS — M25661 Stiffness of right knee, not elsewhere classified: Secondary | ICD-10-CM

## 2024-03-10 DIAGNOSIS — M25561 Pain in right knee: Secondary | ICD-10-CM

## 2024-03-10 DIAGNOSIS — M6281 Muscle weakness (generalized): Secondary | ICD-10-CM

## 2024-03-10 NOTE — Therapy (Signed)
 OUTPATIENT PHYSICAL THERAPY TREATMENT   Patient Name: Theresa Rivera MRN: [id number] DOB:01/24/1961, 63 y.o., female Today's Date: 03/12/2024  END OF SESSION:  PT End of Session - 03/12/24 1812     Visit Number 8    Number of Visits 24    Date for Recertification  04/27/24    Authorization Type Medicare    PT Start Time 1600    PT Stop Time 1643    PT Time Calculation (min) 43 min    Activity Tolerance Patient tolerated treatment well    Behavior During Therapy [facility] for tasks assessed/performed                History reviewed. No pertinent past medical history. Past Surgical History:  Procedure Laterality Date   KNEE ARTHROSCOPY WITH MENISCAL REPAIR Right 01/31/2024   Procedure: ARTHROSCOPY, KNEE, WITH MENISCUS REPAIR;  Surgeon: Genelle Standing, MD;  Location: [facility];  Service: Orthopedics;  Laterality: Right;   RHINOPLASTY  1987   Patient Active Problem List   Diagnosis Date Noted   Acute medial meniscus tear of right knee 01/31/2024   Urticaria 05/20/2022   Hyperglycemia 04/20/2022   Cold sore 04/18/2021   Dyslipidemia 04/18/2021   Other allergic rhinitis 04/12/2013    PCP: Kennyth Worth HERO, MD  REFERRING PROVIDER: Genelle Standing, MD  REFERRING DIAG: [phone] (ICD-10-CM) - Acute medial meniscus tear of right knee, initial encounter; Right knee medial medial repair DOS 01/31/24  THERAPY DIAG:  Right knee pain, unspecified chronicity  Stiffness of right knee, not elsewhere classified  Muscle weakness (generalized)  Rationale for Evaluation and Treatment: Rehabilitation  ONSET DATE: DOS 01/31/24  SUBJECTIVE:   SUBJECTIVE STATEMENT:   The patient continues to report pain. She reports it has not changed much.   EVAL: Patient states finally slept better last night. Loves playing golf but hasn't been able to in the last few months because of this. Golf's right handed. Active prior to injury.  PERTINENT HISTORY: N/a PAIN:   Are you having pain? Yes: NPRS scale: 2/10 at rest, 4/10 with walking Pain location: R knee Pain description: achy/stabbing/throbbing  Aggravating factors: walking, being up on it/WB  Relieving factors: rest, ibuprofen /tylenol , ice   PRECAUTIONS: Other: Right knee medial medial repai  WEIGHT BEARING RESTRICTIONS: WBAT  FALLS:  Has patient fallen in last 6 months? No   PLOF: Independent  PATIENT GOALS: Get back to golf and play with grand kids  OBJECTIVE: (objective measures from initial evaluation unless otherwise dated)  OBSERVATION:  sutures intact, no s/s of infection  PATIENT SURVEYS:  LEFS  Extreme difficulty/unable (0), Quite a bit of difficulty (1), Moderate difficulty (2), Little difficulty (3), No difficulty (4) Survey date:  02/03/24  Any of your usual work, housework or school activities 1  2. Usual hobbies, recreational or sporting activities 0  3. Getting into/out of the bath 3  4. Walking between rooms 4  5. Putting on socks/shoes 4  6. Squatting  2  7. Lifting an object, like a bag of groceries from the floor 2  8. Performing light activities around your home 2  9. Performing heavy activities around your home 3  10. Getting into/out of a car 4  11. Walking 2 blocks 1  12. Walking 1 mile 0  13. Going up/down 10 stairs (1 flight) 2  14. Standing for 1 hour 3  15.  sitting for 1 hour 4  16. Running on even ground 0  17. Running on uneven ground  0  18. Making sharp turns while running fast 0  19. Hopping  0  20. Rolling over in bed 2  Score total:  34/80     COGNITION: Overall cognitive status: Within functional limits for tasks assessed     SENSATION: WFL  EDEMA:  Gross edema R knee  POSTURE: No Significant postural limitations  PALPATION: Hypomobile patellar glides  LOWER EXTREMITY ROM:  Active ROM Right eval Left eval Right 02/07/24 Right 02/29/24  Hip flexion      Hip extension      Hip abduction      Hip adduction      Hip  internal rotation      Hip external rotation      Knee flexion 120 >130 140 Seated 135*  Knee extension 0 0 0 Seated 11*, supine 2*  Ankle dorsiflexion      Ankle plantarflexion      Ankle inversion      Ankle eversion       (Blank rows = not tested) *= pain/symptoms  LOWER EXTREMITY MMT: good quad set, very mild extensor lag with reps of SLR  MMT Right eval Right 02/29/24  Hip flexion    Hip extension    Hip abduction    Hip adduction    Hip internal rotation    Hip external rotation    Knee flexion  4  Knee extension  4- some medial knee pain   Ankle dorsiflexion    Ankle plantarflexion    Ankle inversion    Ankle eversion     (Blank rows = not tested) *= pain/symptoms   FUNCTIONAL TESTS:  NT due to post op status  GAIT: Distance walked: 100 feet Assistive device utilized: None Level of assistance: Complete Independence Comments: minimally antalgic and minimally lacking flexion/ext during gait cycle   TODAY'S TREATMENT:                                                                                                                              DATE:  9/19 Manual: Trigger point release to quad  Reviewed scar tissue release to ports  Patella mobilization   There-ex:  SLR x12 RPE of 3  2x12 1 lb RPE of 6   SAQ  2x12 2 lbs RPE of 3  X12 2.5 lbs   LAQ  3x12  3 lbs   Step up:  2 inch 2x10  Lateral step up 2x10 2 inch   03/07/24 Sci fit bike L4 x85min PROM R knee STM to adductors/medial HS SLR 2x10  Bridge 5 2x15 S/ hip abduction 2x15bil Prone hip extension with knee flexed 2x10ea LAQ with VMO sqz 2.5# 3 3x10 Sit to stands 2x10 R posterior from elevated plinth 2x10 Shuttle press single leg 75# 3x10ea Side stepping in hall with GTB at thighs-cues for glute med activation and hip orientation x4laps at rail Single HR 2x10ea Standing HSC 2.5# 2x10   03/02/24 PROM R knee  STM to adductors/medial HS Patella mobilizations SLR x10  Bridge 5 2x15 S/  hip abduction 2x15bil Prone hip extension with knee flexed 2x10ea Staggered sit to stands 2x10 R posterior from elevated plinth 2x10 Side stepping in hall with GTB at thighs-cues for glute med activation and hip orientation Sci fit bike L4 x64min     02/29/24  Scifit bike L4 x8 minutes seat 8 for w/u ROM, goals   Education on activity modification and tapering- let knee pain settle and then ok to return to walking but try 1/2 mile first and see how it feels. If more pain, drop to 1/4 mile, if no pain stay at 1/2 mile for a few days and can then progress 1/4 mile at a time  SLRs 1.5# 2x10 SLRs + ER 2x10 B Sidelying hip ABD 1.5# B 2x10  Prone hip extensions 1.5# 2x10  Bridge + ABD into red TB x12  Shuttle LE press BLEs 75# 2x12, single leg 37# 2x12 B (no knee flexion over 90* as a precaution to avoid excessive stress on repair) STS red TB above knees x10 Standing hip ABD x10 B red TB  Mini-squats red TB above knees x12  Hip hikes x12 B Hip hikes + swing x8 B (pain limited) HS stretch R 2x30 seconds Quad/hip flexor stretch R 2x30 seconds     PATIENT EDUCATION:  Education details: Patient educated on exam findings, POC, scope of PT, HEP, relevant anatomy and biomechanics, and protocol/precautions. Person educated: Patient Education method: Explanation, Demonstration, and Handouts Education comprehension: verbalized understanding, returned demonstration, verbal cues required, and tactile cues required  HOME EXERCISE PROGRAM: Access Code: [email or web address] URL: [email or web address] Date: 02/03/2024 Prepared by: Prentice Zaunegger  Exercises - Long Sitting Quad Set  - 5 x daily - 7 x weekly - 2 sets - 10 reps - 10 second hold - Supine Heel Slide with Strap  - 5 x daily - 7 x weekly - 10 reps - 10 second hold - Active Straight Leg Raise with Quad Set  - 5 x daily - 7 x weekly - 2 sets - 10 reps - Long Sitting Calf Stretch with Strap  - 1 x daily - 7 x weekly - 3 reps - 20  second hold - Seated Ankle Pumps  - 5 x daily - 7 x weekly - 20 reps  ASSESSMENT:  CLINICAL IMPRESSION: Therapy advanced weight using RPE. She tolerated well> She had no significant increase in pain. Therapy will continue to progress exercises as tolerated.   She felt like her weights were still somewhat easy with her SAQ and LAQ We will progress next visit. We also began stair training. She tolerated well.   OBJECTIVE IMPAIRMENTS: Abnormal gait, decreased activity tolerance, decreased balance, decreased endurance, decreased mobility, difficulty walking, decreased ROM, decreased strength, increased muscle spasms, impaired flexibility, improper body mechanics, and pain  ACTIVITY LIMITATIONS: lifting, bending, standing, squatting, stairs, transfers, bed mobility, bathing, hygiene/grooming, locomotion level, and caring for others  PARTICIPATION LIMITATIONS: meal prep, cleaning, laundry, driving, shopping, community activity, occupation, and yard work  PERSONAL FACTORS: Time since onset of injury/illness/exacerbation are also affecting patient's functional outcome.   REHAB POTENTIAL: Good  CLINICAL DECISION MAKING: Stable/uncomplicated  EVALUATION COMPLEXITY: Low  GOALS: Goals reviewed with patient? Yes  SHORT TERM GOALS: Target date: 03/16/2024    Patient will be independent with HEP in order to improve functional outcomes. Baseline: Goal status: MET 02/29/24  2.  Patient will report at least 25% improvement in symptoms for improved quality of  life. Baseline: Goal status: IN PROGRESS 02/29/24  3.  Patient will demonstrate knee ROM from 0 to 135 for improved mechanics with ADL.  Baseline:  Goal status: MET 02/29/24  4.  Patient able to tolerate basic closed chain exercises without joint irritation for ability to improve quad strength. Baseline:  Goal status: IN PROGRESS 02/29/24    LONG TERM GOALS: Target date: 04/27/2024    Patient will report at least 75% improvement in symptoms  for improved quality of life. Baseline:  Goal status: INITIAL  2.  Patient will improve LEFS score by at least 24 points in order to indicate improved tolerance to activity. Baseline:  Goal status: INITIAL  3.  Patient will be able to perform forward step down test without deviation in order to demonstrate improved LE strength and motor control.  Baseline:  Goal status: INITIAL  4.  Patient will demonstrate MMT 80% of contralateral lower extremity in all tested musculature as evidence of improved strength to assist with stair ambulation and gait. Baseline:  Goal status: INITIAL  5.  Patient will be able to return to all activities unrestricted for improved ability to perform chores and participate with family.  Baseline:  Goal status: INITIAL     PLAN:  PT FREQUENCY: 1-2x/week  PT DURATION: 12 weeks  PLANNED INTERVENTIONS: 97164- PT Re-evaluation, 97110-Therapeutic exercises, 97530- Therapeutic activity, [id number]- Neuromuscular re-education, 97535- Self Care, [id number]- Manual therapy, [id number]- Gait training, [phone]- Orthotic Fit/training, [phone]- Canalith repositioning, [id number]- Aquatic Therapy, [phone]- Splinting, [phone]- Wound care (first 20 sq cm), 97598- Wound care (each additional 20 sq cm)Patient/Family education, Balance training, Stair training, Taping, Dry Needling, Joint mobilization, Joint manipulation, Spinal manipulation, Spinal mobilization, Scar mobilization, and DME instructions.  PLAN FOR NEXT SESSION: progress with mensicus repair protocol, 4 weeks out as of 02/28/24  Sees MD 03/17/24   Asberry Rodes, PTA  03/12/24 6:12 PM

## 2024-03-13 ENCOUNTER — Encounter (HOSPITAL_BASED_OUTPATIENT_CLINIC_OR_DEPARTMENT_OTHER): Payer: Self-pay

## 2024-03-13 ENCOUNTER — Ambulatory Visit (HOSPITAL_BASED_OUTPATIENT_CLINIC_OR_DEPARTMENT_OTHER): Payer: Self-pay

## 2024-03-13 DIAGNOSIS — M6281 Muscle weakness (generalized): Secondary | ICD-10-CM

## 2024-03-13 DIAGNOSIS — M25661 Stiffness of right knee, not elsewhere classified: Secondary | ICD-10-CM

## 2024-03-13 DIAGNOSIS — M25561 Pain in right knee: Secondary | ICD-10-CM | POA: Diagnosis not present

## 2024-03-13 NOTE — Therapy (Signed)
 OUTPATIENT PHYSICAL THERAPY TREATMENT   Patient Name: Theresa Rivera MRN: [id number] DOB:1961/06/02, 63 y.o., female Today's Date: 03/13/2024  END OF SESSION:  PT End of Session - 03/13/24 1519     Visit Number 9    Number of Visits 24    Date for Recertification  04/27/24    Authorization Type Medicare    Progress Note Due on Visit 10    PT Start Time 1518    PT Stop Time 1557    PT Time Calculation (min) 39 min    Activity Tolerance Patient tolerated treatment well    Behavior During Therapy [facility] for tasks assessed/performed                 History reviewed. No pertinent past medical history. Past Surgical History:  Procedure Laterality Date   KNEE ARTHROSCOPY WITH MENISCAL REPAIR Right 01/31/2024   Procedure: ARTHROSCOPY, KNEE, WITH MENISCUS REPAIR;  Surgeon: Genelle Standing, MD;  Location: [facility];  Service: Orthopedics;  Laterality: Right;   RHINOPLASTY  1987   Patient Active Problem List   Diagnosis Date Noted   Acute medial meniscus tear of right knee 01/31/2024   Urticaria 05/20/2022   Hyperglycemia 04/20/2022   Cold sore 04/18/2021   Dyslipidemia 04/18/2021   Other allergic rhinitis 04/12/2013    PCP: Kennyth Worth HERO, MD  REFERRING PROVIDER: Genelle Standing, MD  REFERRING DIAG: [phone] (ICD-10-CM) - Acute medial meniscus tear of right knee, initial encounter; Right knee medial medial repair DOS 01/31/24  THERAPY DIAG:  Right knee pain, unspecified chronicity  Stiffness of right knee, not elsewhere classified  Muscle weakness (generalized)  Rationale for Evaluation and Treatment: Rehabilitation  ONSET DATE: DOS 01/31/24  SUBJECTIVE:   SUBJECTIVE STATEMENT:   Pt reports improved pain level. Still some tenderness over medial knee. Has increased stiffness after last session when she was sitting at a restaurant for awhile.   EVAL: Patient states finally slept better last night. Loves playing golf but hasn't been  able to in the last few months because of this. Golf's right handed. Active prior to injury.  PERTINENT HISTORY: N/a PAIN:  Are you having pain? Yes: NPRS scale: 2/10 at rest, 4/10 with walking Pain location: R knee Pain description: achy/stabbing/throbbing  Aggravating factors: walking, being up on it/WB  Relieving factors: rest, ibuprofen /tylenol , ice   PRECAUTIONS: Other: Right knee medial medial repai  WEIGHT BEARING RESTRICTIONS: WBAT  FALLS:  Has patient fallen in last 6 months? No   PLOF: Independent  PATIENT GOALS: Get back to golf and play with grand kids  OBJECTIVE: (objective measures from initial evaluation unless otherwise dated)  OBSERVATION:  sutures intact, no s/s of infection  PATIENT SURVEYS:  LEFS  Extreme difficulty/unable (0), Quite a bit of difficulty (1), Moderate difficulty (2), Little difficulty (3), No difficulty (4) Survey date:  02/03/24  Any of your usual work, housework or school activities 1  2. Usual hobbies, recreational or sporting activities 0  3. Getting into/out of the bath 3  4. Walking between rooms 4  5. Putting on socks/shoes 4  6. Squatting  2  7. Lifting an object, like a bag of groceries from the floor 2  8. Performing light activities around your home 2  9. Performing heavy activities around your home 3  10. Getting into/out of a car 4  11. Walking 2 blocks 1  12. Walking 1 mile 0  13. Going up/down 10 stairs (1 flight) 2  14. Standing for 1  hour 3  15.  sitting for 1 hour 4  16. Running on even ground 0  17. Running on uneven ground 0  18. Making sharp turns while running fast 0  19. Hopping  0  20. Rolling over in bed 2  Score total:  34/80     COGNITION: Overall cognitive status: Within functional limits for tasks assessed     SENSATION: WFL  EDEMA:  Gross edema R knee  POSTURE: No Significant postural limitations  PALPATION: Hypomobile patellar glides  LOWER EXTREMITY ROM:  Active ROM Right eval  Left eval Right 02/07/24 Right 02/29/24  Hip flexion      Hip extension      Hip abduction      Hip adduction      Hip internal rotation      Hip external rotation      Knee flexion 120 >130 140 Seated 135*  Knee extension 0 0 0 Seated 11*, supine 2*  Ankle dorsiflexion      Ankle plantarflexion      Ankle inversion      Ankle eversion       (Blank rows = not tested) *= pain/symptoms  LOWER EXTREMITY MMT: good quad set, very mild extensor lag with reps of SLR  MMT Right eval Right 02/29/24  Hip flexion    Hip extension    Hip abduction    Hip adduction    Hip internal rotation    Hip external rotation    Knee flexion  4  Knee extension  4- some medial knee pain   Ankle dorsiflexion    Ankle plantarflexion    Ankle inversion    Ankle eversion     (Blank rows = not tested) *= pain/symptoms   FUNCTIONAL TESTS:  NT due to post op status  GAIT: Distance walked: 100 feet Assistive device utilized: None Level of assistance: Complete Independence Comments: minimally antalgic and minimally lacking flexion/ext during gait cycle   TODAY'S TREATMENT:                                                                                                                              DATE:   03/13/24 Sci fit bike L4 x45min PROM R knee STM to adductors/medial HS SLR 2x10  Bridge 5 2x15 S/ hip abduction 2x15bil Prone hip extension with knee flexed 2x15ea LAQ with VMO sqz 2.5# 3 2x10 Sit to stands 2x10 Seated HSC GTB 2x15 Shuttle press single leg 75# 3x10ea Side stepping in hall with GTB at thighs-cues for glute med activation and hip orientation x4laps at rail -Partial lunges x10ea -Sumo squats 2x10 -single HR (bothered knee so stopped)    9/19 Manual: Trigger point release to quad  Reviewed scar tissue release to ports  Patella mobilization   There-ex:  SLR x12 RPE of 3  2x12 1 lb RPE of 6   SAQ  2x12 2 lbs RPE of 3  X12 2.5 lbs  LAQ  3x12  3 lbs   Step up:   2 inch 2x10  Lateral step up 2x10 2 inch   03/07/24 Sci fit bike L4 x15min PROM R knee STM to adductors/medial HS SLR 2x10  Bridge 5 2x15 S/ hip abduction 2x15bil Prone hip extension with knee flexed 2x10ea LAQ with VMO sqz 2.5# 3 3x10 Sit to stands 2x10 R posterior from elevated plinth 2x10 Shuttle press single leg 75# 3x10ea Side stepping in hall with GTB at thighs-cues for glute med activation and hip orientation x4laps at rail Single HR 2x10ea Standing HSC 2.5# 2x10   03/02/24 PROM R knee STM to adductors/medial HS Patella mobilizations SLR x10  Bridge 5 2x15 S/ hip abduction 2x15bil Prone hip extension with knee flexed 2x10ea Staggered sit to stands 2x10 R posterior from elevated plinth 2x10 Side stepping in hall with GTB at thighs-cues for glute med activation and hip orientation Sci fit bike L4 x42min     02/29/24  Scifit bike L4 x8 minutes seat 8 for w/u ROM, goals   Education on activity modification and tapering- let knee pain settle and then ok to return to walking but try 1/2 mile first and see how it feels. If more pain, drop to 1/4 mile, if no pain stay at 1/2 mile for a few days and can then progress 1/4 mile at a time  SLRs 1.5# 2x10 SLRs + ER 2x10 B Sidelying hip ABD 1.5# B 2x10  Prone hip extensions 1.5# 2x10  Bridge + ABD into red TB x12  Shuttle LE press BLEs 75# 2x12, single leg 37# 2x12 B (no knee flexion over 90* as a precaution to avoid excessive stress on repair) STS red TB above knees x10 Standing hip ABD x10 B red TB  Mini-squats red TB above knees x12  Hip hikes x12 B Hip hikes + swing x8 B (pain limited) HS stretch R 2x30 seconds Quad/hip flexor stretch R 2x30 seconds     PATIENT EDUCATION:  Education details: Patient educated on exam findings, POC, scope of PT, HEP, relevant anatomy and biomechanics, and protocol/precautions. Person educated: Patient Education method: Explanation, Demonstration, and Handouts Education  comprehension: verbalized understanding, returned demonstration, verbal cues required, and tactile cues required  HOME EXERCISE PROGRAM: Access Code: [email or web address] URL: [email or web address] Date: 02/03/2024 Prepared by: Prentice Zaunegger  Exercises - Long Sitting Quad Set  - 5 x daily - 7 x weekly - 2 sets - 10 reps - 10 second hold - Supine Heel Slide with Strap  - 5 x daily - 7 x weekly - 10 reps - 10 second hold - Active Straight Leg Raise with Quad Set  - 5 x daily - 7 x weekly - 2 sets - 10 reps - Long Sitting Calf Stretch with Strap  - 1 x daily - 7 x weekly - 3 reps - 20 second hold - Seated Ankle Pumps  - 5 x daily - 7 x weekly - 20 reps  ASSESSMENT:  CLINICAL IMPRESSION: Pt tolerated session well without significant c/o. She did report pain with single HR, so immediately discontinued. Able to progress gently with therAct interventions. Continues to have tenderness in medial HS, address by STM to decrease tightness here. Will continue to gently progress while monitoring pain levels.   OBJECTIVE IMPAIRMENTS: Abnormal gait, decreased activity tolerance, decreased balance, decreased endurance, decreased mobility, difficulty walking, decreased ROM, decreased strength, increased muscle spasms, impaired flexibility, improper body mechanics, and pain  ACTIVITY LIMITATIONS: lifting, bending, standing, squatting, stairs, transfers, bed  mobility, bathing, hygiene/grooming, locomotion level, and caring for others  PARTICIPATION LIMITATIONS: meal prep, cleaning, laundry, driving, shopping, community activity, occupation, and yard work  PERSONAL FACTORS: Time since onset of injury/illness/exacerbation are also affecting patient's functional outcome.   REHAB POTENTIAL: Good  CLINICAL DECISION MAKING: Stable/uncomplicated  EVALUATION COMPLEXITY: Low  GOALS: Goals reviewed with patient? Yes  SHORT TERM GOALS: Target date: 03/16/2024    Patient will be independent with HEP in  order to improve functional outcomes. Baseline: Goal status: MET 02/29/24  2.  Patient will report at least 25% improvement in symptoms for improved quality of life. Baseline: Goal status: IN PROGRESS 02/29/24  3.  Patient will demonstrate knee ROM from 0 to 135 for improved mechanics with ADL.  Baseline:  Goal status: MET 02/29/24  4.  Patient able to tolerate basic closed chain exercises without joint irritation for ability to improve quad strength. Baseline:  Goal status: IN PROGRESS 02/29/24    LONG TERM GOALS: Target date: 04/27/2024    Patient will report at least 75% improvement in symptoms for improved quality of life. Baseline:  Goal status: INITIAL  2.  Patient will improve LEFS score by at least 24 points in order to indicate improved tolerance to activity. Baseline:  Goal status: INITIAL  3.  Patient will be able to perform forward step down test without deviation in order to demonstrate improved LE strength and motor control.  Baseline:  Goal status: INITIAL  4.  Patient will demonstrate MMT 80% of contralateral lower extremity in all tested musculature as evidence of improved strength to assist with stair ambulation and gait. Baseline:  Goal status: INITIAL  5.  Patient will be able to return to all activities unrestricted for improved ability to perform chores and participate with family.  Baseline:  Goal status: INITIAL     PLAN:  PT FREQUENCY: 1-2x/week  PT DURATION: 12 weeks  PLANNED INTERVENTIONS: 97164- PT Re-evaluation, 97110-Therapeutic exercises, 97530- Therapeutic activity, [id number]- Neuromuscular re-education, 97535- Self Care, 02859- Manual therapy, [id number]- Gait training, [phone]- Orthotic Fit/training, [phone]- Canalith repositioning, [id number]- Aquatic Therapy, [phone]- Splinting, [phone]- Wound care (first 20 sq cm), 97598- Wound care (each additional 20 sq cm)Patient/Family education, Balance training, Stair training, Taping, Dry Needling, Joint mobilization,  Joint manipulation, Spinal manipulation, Spinal mobilization, Scar mobilization, and DME instructions.  PLAN FOR NEXT SESSION: progress with mensicus repair protocol, 4 weeks out as of 02/28/24  Sees MD 03/17/24   Asberry Rodes, PTA  03/13/24 5:20 PM

## 2024-03-14 ENCOUNTER — Encounter (HOSPITAL_BASED_OUTPATIENT_CLINIC_OR_DEPARTMENT_OTHER)

## 2024-03-15 ENCOUNTER — Ambulatory Visit (HOSPITAL_BASED_OUTPATIENT_CLINIC_OR_DEPARTMENT_OTHER): Admitting: Physical Therapy

## 2024-03-15 ENCOUNTER — Encounter (HOSPITAL_BASED_OUTPATIENT_CLINIC_OR_DEPARTMENT_OTHER): Payer: Self-pay | Admitting: Physical Therapy

## 2024-03-15 DIAGNOSIS — M25561 Pain in right knee: Secondary | ICD-10-CM | POA: Diagnosis not present

## 2024-03-15 DIAGNOSIS — M6281 Muscle weakness (generalized): Secondary | ICD-10-CM

## 2024-03-15 DIAGNOSIS — R29898 Other symptoms and signs involving the musculoskeletal system: Secondary | ICD-10-CM

## 2024-03-15 DIAGNOSIS — R2689 Other abnormalities of gait and mobility: Secondary | ICD-10-CM

## 2024-03-15 DIAGNOSIS — M25661 Stiffness of right knee, not elsewhere classified: Secondary | ICD-10-CM

## 2024-03-15 NOTE — Therapy (Addendum)
 OUTPATIENT PHYSICAL THERAPY TREATMENT   Patient Name: Theresa Rivera MRN: [id number] DOB:[birth year], 63 y.o., female Today's Date: 03/15/2024  END OF SESSION:  PT End of Session - 03/15/24 1013     Visit Number 10    Number of Visits 24    Date for Recertification  04/27/24    Authorization Type Medicare    PT Start Time 1015    PT Stop Time 1100    PT Time Calculation (min) 45 min    Activity Tolerance Patient tolerated treatment well    Behavior During Therapy [facility] for tasks assessed/performed          Progress Note   Reporting Period 02/03/2024 to 03/15/2024   See note below for Objective Data and Assessment of Progress/Goals   History reviewed. No pertinent past medical history. Past Surgical History:  Procedure Laterality Date   KNEE ARTHROSCOPY WITH MENISCAL REPAIR Right 01/31/2024   Procedure: ARTHROSCOPY, KNEE, WITH MENISCUS REPAIR;  Surgeon: Genelle Standing, MD;  Location: [facility];  Service: Orthopedics;  Laterality: Right;   RHINOPLASTY  1987   Patient Active Problem List   Diagnosis Date Noted   Acute medial meniscus tear of right knee 01/31/2024   Urticaria 05/20/2022   Hyperglycemia 04/20/2022   Cold sore 04/18/2021   Dyslipidemia 04/18/2021   Other allergic rhinitis 04/12/2013    PCP: Kennyth Worth HERO, MD  REFERRING PROVIDER: Genelle Standing, MD  REFERRING DIAG: [phone] (ICD-10-CM) - Acute medial meniscus tear of right knee, initial encounter; Right knee medial medial repair DOS 01/31/24  THERAPY DIAG:  Right knee pain, unspecified chronicity  Other abnormalities of gait and mobility  Stiffness of right knee, not elsewhere classified  Muscle weakness (generalized)  Other symptoms and signs involving the musculoskeletal system  Rationale for Evaluation and Treatment: Rehabilitation  ONSET DATE: DOS 01/31/24  SUBJECTIVE:   SUBJECTIVE STATEMENT: Patient reports she is doing good today, only a few things that  make it painful like toe raises. 1/10 pain this morning. More uncomfortable than pain. Warmed up on bike before coming to therapy.  EVAL: Patient states finally slept better last night. Loves playing golf but hasn't been able to in the last few months because of this. Golf's right handed. Active prior to injury.  PERTINENT HISTORY: N/a PAIN:  Are you having pain? Yes: NPRS scale: 2/10 at rest, 4/10 with walking Pain location: R knee Pain description: achy/stabbing/throbbing  Aggravating factors: walking, being up on it/WB  Relieving factors: rest, ibuprofen /tylenol , ice   PRECAUTIONS: Other: Right knee medial medial repai  WEIGHT BEARING RESTRICTIONS: WBAT  FALLS:  Has patient fallen in last 6 months? No   PLOF: Independent  PATIENT GOALS: Get back to golf and play with grand kids  OBJECTIVE: (objective measures from initial evaluation unless otherwise dated)  OBSERVATION:  sutures intact, no s/s of infection  PATIENT SURVEYS:  LEFS  Extreme difficulty/unable (0), Quite a bit of difficulty (1), Moderate difficulty (2), Little difficulty (3), No difficulty (4) Survey date:  02/03/24   Any of your usual work, housework or school activities 1   2. Usual hobbies, recreational or sporting activities 0   3. Getting into/out of the bath 3   4. Walking between rooms 4   5. Putting on socks/shoes 4   6. Squatting  2   7. Lifting an object, like a bag of groceries from the floor 2   8. Performing light activities around your home 2   9. Performing heavy activities around  your home 3   10. Getting into/out of a car 4   11. Walking 2 blocks 1   12. Walking 1 mile 0   13. Going up/down 10 stairs (1 flight) 2   14. Standing for 1 hour 3   15.  sitting for 1 hour 4   16. Running on even ground 0   17. Running on uneven ground 0   18. Making sharp turns while running fast 0   19. Hopping  0   20. Rolling over in bed 2   Score total:  34/80 63/80     COGNITION: Overall  cognitive status: Within functional limits for tasks assessed     SENSATION: WFL  EDEMA:  Gross edema R knee  POSTURE: No Significant postural limitations  PALPATION: Hypomobile patellar glides  LOWER EXTREMITY ROM:  Active ROM Right eval Left eval Right 02/07/24 Right 02/29/24 Right  9/24   Hip flexion        Hip extension        Hip abduction        Hip adduction        Hip internal rotation        Hip external rotation        Knee flexion 120 >130 140 Seated 135* 132   Knee extension 0 0 0 Seated 11*, supine 2* 0   Ankle dorsiflexion        Ankle plantarflexion        Ankle inversion        Ankle eversion         (Blank rows = not tested) *= pain/symptoms  LOWER EXTREMITY MMT: good quad set, very mild extensor lag with reps of SLR  MMT Right eval Right 02/29/24 Right 9/24 Left 9/24  Hip flexion   27.9 22.4  Hip extension      Hip abduction   38.6 32.6  Hip adduction      Hip internal rotation      Hip external rotation      Knee flexion  4    Knee extension  4- some medial knee pain     Ankle dorsiflexion      Ankle plantarflexion      Ankle inversion      Ankle eversion       (Blank rows = not tested) *= pain/symptoms   FUNCTIONAL TESTS:  5 times sit to stand: 5.41 seconds (9/24) NT due to post op status  GAIT: Distance walked: 100 feet Assistive device utilized: None Level of assistance: Complete Independence Comments: minimally antalgic and minimally lacking flexion/ext during gait cycle   TODAY'S TREATMENT:                                                                                                                              DATE:  03/15/24  There-ex: SLR 3x12 3# ankle weight  Prone hip extension with knee flexed 2x15ea LAQ x10 2.5#,  2x10 5#  Neuro-re-ed:  Bridge 5 2x15 Side stepping in hall with GTB at thighs-cues for glute med activation and hip orientation x4laps at rail, progressed to BTB x14 laps   Manual: STM to  adductors/medial HS  03/13/24 Sci fit bike L4 x45min PROM R knee STM to adductors/medial HS SLR 2x10  Bridge 5 2x15 S/ hip abduction 2x15bil Prone hip extension with knee flexed 2x15ea LAQ with VMO sqz 2.5# 3 2x10 Sit to stands 2x10 Seated HSC GTB 2x15 Shuttle press single leg 75# 3x10ea Side stepping in hall with GTB at thighs-cues for glute med activation and hip orientation x4laps at rail -Partial lunges x10ea -Sumo squats 2x10 -single HR (bothered knee so stopped)  9/19 Manual: Trigger point release to quad  Reviewed scar tissue release to ports  Patella mobilization   There-ex:  SLR x12 RPE of 3  2x12 1 lb RPE of 6   SAQ  2x12 2 lbs RPE of 3  X12 2.5 lbs   LAQ  3x12  3 lbs   Step up:  2 inch 2x10  Lateral step up 2x10 2 inch   03/07/24 Sci fit bike L4 x7min PROM R knee STM to adductors/medial HS SLR 2x10  Bridge 5 2x15 S/ hip abduction 2x15bil Prone hip extension with knee flexed 2x10ea LAQ with VMO sqz 2.5# 3 3x10 Sit to stands 2x10 R posterior from elevated plinth 2x10 Shuttle press single leg 75# 3x10ea Side stepping in hall with GTB at thighs-cues for glute med activation and hip orientation x4laps at rail Single HR 2x10ea Standing HSC 2.5# 2x10   03/02/24 PROM R knee STM to adductors/medial HS Patella mobilizations SLR x10  Bridge 5 2x15 S/ hip abduction 2x15bil Prone hip extension with knee flexed 2x10ea Staggered sit to stands 2x10 R posterior from elevated plinth 2x10 Side stepping in hall with GTB at thighs-cues for glute med activation and hip orientation Sci fit bike L4 x40min     02/29/24  Scifit bike L4 x8 minutes seat 8 for w/u ROM, goals   Education on activity modification and tapering- let knee pain settle and then ok to return to walking but try 1/2 mile first and see how it feels. If more pain, drop to 1/4 mile, if no pain stay at 1/2 mile for a few days and can then progress 1/4 mile at a time  SLRs 1.5# 2x10 SLRs  + ER 2x10 B Sidelying hip ABD 1.5# B 2x10  Prone hip extensions 1.5# 2x10  Bridge + ABD into red TB x12  Shuttle LE press BLEs 75# 2x12, single leg 37# 2x12 B (no knee flexion over 90* as a precaution to avoid excessive stress on repair) STS red TB above knees x10 Standing hip ABD x10 B red TB  Mini-squats red TB above knees x12  Hip hikes x12 B Hip hikes + swing x8 B (pain limited) HS stretch R 2x30 seconds Quad/hip flexor stretch R 2x30 seconds   PATIENT EDUCATION:  Education details: Patient educated on exam findings, POC, scope of PT, HEP, relevant anatomy and biomechanics, and protocol/precautions. Person educated: Patient Education method: Explanation, Demonstration, and Handouts Education comprehension: verbalized understanding, returned demonstration, verbal cues required, and tactile cues required  HOME EXERCISE PROGRAM: Access Code: [email or web address] URL: [email or web address] Date: 02/03/2024 Prepared by: Prentice Zaunegger  Exercises - Long Sitting Quad Set  - 5 x daily - 7 x weekly - 2 sets - 10 reps - 10 second hold - Supine Heel Slide with Strap  - 5  x daily - 7 x weekly - 10 reps - 10 second hold - Active Straight Leg Raise with Quad Set  - 5 x daily - 7 x weekly - 2 sets - 10 reps - Long Sitting Calf Stretch with Strap  - 1 x daily - 7 x weekly - 3 reps - 20 second hold - Seated Ankle Pumps  - 5 x daily - 7 x weekly - 20 reps  ASSESSMENT:  CLINICAL IMPRESSION: Pt tolerated session well without an increase in pain.Patient has met all short-term goals and 2/5 long term goals. Patient still remains limited in strength, endurance, and range of motion. Tolerated exercise well. Reported feeling great after manual therapy. Will continue to benefit from therapy to address remaining limitations and return to PLOF.   OBJECTIVE IMPAIRMENTS: Abnormal gait, decreased activity tolerance, decreased balance, decreased endurance, decreased mobility, difficulty walking,  decreased ROM, decreased strength, increased muscle spasms, impaired flexibility, improper body mechanics, and pain  ACTIVITY LIMITATIONS: lifting, bending, standing, squatting, stairs, transfers, bed mobility, bathing, hygiene/grooming, locomotion level, and caring for others  PARTICIPATION LIMITATIONS: meal prep, cleaning, laundry, driving, shopping, community activity, occupation, and yard work  PERSONAL FACTORS: Time since onset of injury/illness/exacerbation are also affecting patient's functional outcome.   REHAB POTENTIAL: Good  CLINICAL DECISION MAKING: Stable/uncomplicated  EVALUATION COMPLEXITY: Low  GOALS: Goals reviewed with patient? Yes  SHORT TERM GOALS: Target date: 03/16/2024    Patient will be independent with HEP in order to improve functional outcomes. Baseline: Goal status: MET 02/29/24  2.  Patient will report at least 25% improvement in symptoms for improved quality of life. Baseline: Goal status: MET 03/15/24  3.  Patient will demonstrate knee ROM from 0 to 135 for improved mechanics with ADL.  Baseline:  Goal status: MET 02/29/24  4.  Patient able to tolerate basic closed chain exercises without joint irritation for ability to improve quad strength. Baseline:  Goal status: MET 03/15/24    LONG TERM GOALS: Target date: 04/27/2024    Patient will report at least 75% improvement in symptoms for improved quality of life. Baseline:  Goal status: MET 03/15/24  2.  Patient will improve LEFS score by at least 24 points in order to indicate improved tolerance to activity. Baseline:  Goal status: MET 03/15/24  3.  Patient will be able to perform forward step down test without deviation in order to demonstrate improved LE strength and motor control.  Baseline:  Goal status: IN PROGRESS 03/15/24  4.  Patient will demonstrate MMT 80% of contralateral lower extremity in all tested musculature as evidence of improved strength to assist with stair ambulation and  gait. Baseline:  Goal status: IN PROGRESS 03/15/24  5.  Patient will be able to return to all activities unrestricted for improved ability to perform chores and participate with family.  Baseline:  Goal status: IN PROGRESS 03/15/24     PLAN:  PT FREQUENCY: 1-2x/week  PT DURATION: 12 weeks  PLANNED INTERVENTIONS: 97164- PT Re-evaluation, 97110-Therapeutic exercises, 97530- Therapeutic activity, 97112- Neuromuscular re-education, 97535- Self Care, [id number]- Manual therapy, [phone]- Gait training, [phone]- Orthotic Fit/training, [phone]- Canalith repositioning, [id number]- Aquatic Therapy, [phone]- Splinting, [phone]- Wound care (first 20 sq cm), 97598- Wound care (each additional 20 sq cm)Patient/Family education, Balance training, Stair training, Taping, Dry Needling, Joint mobilization, Joint manipulation, Spinal manipulation, Spinal mobilization, Scar mobilization, and DME instructions.  PLAN FOR NEXT SESSION: progress with mensicus repair protocol, 4 weeks out as of 02/28/24  Sees MD 03/17/24   Asberry Rodes,  PTA  03/15/24 10:14 AM

## 2024-03-17 ENCOUNTER — Encounter (HOSPITAL_BASED_OUTPATIENT_CLINIC_OR_DEPARTMENT_OTHER): Admitting: Physical Therapy

## 2024-03-17 ENCOUNTER — Other Ambulatory Visit (HOSPITAL_BASED_OUTPATIENT_CLINIC_OR_DEPARTMENT_OTHER): Payer: Self-pay

## 2024-03-17 ENCOUNTER — Encounter: Payer: Self-pay | Admitting: Family Medicine

## 2024-03-17 ENCOUNTER — Encounter (HOSPITAL_BASED_OUTPATIENT_CLINIC_OR_DEPARTMENT_OTHER): Payer: Self-pay | Admitting: Physical Therapy

## 2024-03-17 ENCOUNTER — Ambulatory Visit (HOSPITAL_BASED_OUTPATIENT_CLINIC_OR_DEPARTMENT_OTHER): Admitting: Orthopaedic Surgery

## 2024-03-17 DIAGNOSIS — S83241A Other tear of medial meniscus, current injury, right knee, initial encounter: Secondary | ICD-10-CM | POA: Diagnosis not present

## 2024-03-17 MED ORDER — LIDOCAINE HCL 1 % IJ SOLN
4.0000 mL | INTRAMUSCULAR | Status: AC | PRN
  Administered 2024-03-17: 4 mL

## 2024-03-17 NOTE — Progress Notes (Addendum)
 Post Operative Evaluation    Procedure/Date of Surgery: Right knee medial meniscal repair 8/11  Interval History:    Presents today 6 weeks status post above procedure.  She is still having some soreness about the medial aspect of the joint which is slowly improving.   PMH/PSH/Family History/Social History/Meds/Allergies:   No past medical history on file. Past Surgical History:  Procedure Laterality Date  . KNEE ARTHROSCOPY WITH MENISCAL REPAIR Right 01/31/2024   Procedure: ARTHROSCOPY, KNEE, WITH MENISCUS REPAIR;  Surgeon: Genelle Standing, MD;  Location: [facility];  Service: Orthopedics;  Laterality: Right;  . RHINOPLASTY  1987   Social History   Socioeconomic History  . Marital status: Married    Spouse name: Not on file  . Number of children: Not on file  . Years of education: Not on file  . Highest education level: Master's degree (e.g., MA, MS, MEng, MEd, MSW, MBA)  Occupational History  . Not on file  Tobacco Use  . Smoking status: Never  . Smokeless tobacco: Never  Vaping Use  . Vaping status: Never Used  Substance and Sexual Activity  . Alcohol use: Yes    Alcohol/week: 7.0 standard drinks of alcohol    Types: 7 Glasses of wine per week    Comment: occasional  . Drug use: Never  . Sexual activity: Yes  Other Topics Concern  . Not on file  Social History Narrative  . Not on file   Social Drivers of Health   Financial Resource Strain: Low Risk  (01/24/2024)   Overall Financial Resource Strain (CARDIA)   . Difficulty of Paying Living Expenses: Not hard at all  Food Insecurity: No Food Insecurity (01/24/2024)   Hunger Vital Sign   . Worried About Programme researcher, broadcasting/film/video in the Last Year: Never true   . Ran Out of Food in the Last Year: Never true  Transportation Needs: No Transportation Needs (01/24/2024)   PRAPARE - Transportation   . Lack of Transportation (Medical): No   . Lack of Transportation (Non-Medical):  No  Physical Activity: Unknown (01/24/2024)   Exercise Vital Sign   . Days of Exercise per Week: Patient declined   . Minutes of Exercise per Session: Not on file  Stress: No Stress Concern Present (01/24/2024)   [employer] of Occupational Health - Occupational Stress Questionnaire   . Feeling of Stress: Not at all  Social Connections: Socially Integrated (01/24/2024)   Social Connection and Isolation Panel   . Frequency of Communication with Friends and Family: More than three times a week   . Frequency of Social Gatherings with Friends and Family: More than three times a week   . Attends Religious Services: More than 4 times per year   . Active Member of Clubs or Organizations: Yes   . Attends Banker Meetings: More than 4 times per year   . Marital Status: Married   Family History  Problem Relation Age of Onset  . Prostate cancer Father   . Breast cancer Maternal Grandmother 47  . Eczema Son    No Known Allergies Current Outpatient Medications  Medication Sig Dispense Refill  . acyclovir  (ZOVIRAX ) 200 MG capsule Take 4 capsules (800 mg total) by mouth 2 (two) times daily. Take for 5 days as needed for flares 90 capsule 1  .  aspirin  EC 325 MG tablet Take 1 tablet (325 mg total) by mouth daily. 14 tablet 0  . oxyCODONE  (ROXICODONE ) 5 MG immediate release tablet Take 1 tablet (5 mg total) by mouth every 4 (four) hours as needed for severe pain (pain score 7-10) or breakthrough pain. 10 tablet 0   No current facility-administered medications for this visit.   No results found.  Review of Systems:   A ROS was performed including pertinent positives and negatives as documented in the HPI.   Musculoskeletal Exam:    There were no vitals taken for this visit.  Right knee incisions are well-appearing without erythema or drainage.  Range of motion is from 0 to 120 degrees.  There is some periportal tenderness  Imaging:      I personally reviewed and interpreted  the radiographs.   Assessment:   6 weeks status post right knee medial meniscal repair with some evidence of periportal pain and nerve related pain.  This time she is not having some soreness about the deeper portion of the joint and given this I have recommended a Toradol  injection for pain relief and I will plan to see her back in 6 weeks for reassessment  Plan :    - Return to clinic 6 weeks for reassessment     Procedure Note  Patient: Theresa Rivera             Date of Birth: 11-22-1960           MRN: [id number]             Visit Date: 03/17/2024  Procedures: Visit Diagnoses:  1. Acute medial meniscus tear of right knee, initial encounter     Large Joint Inj on 03/17/2024 12:27 PM Indications: pain Details: 22 G 1.5 in needle, ultrasound-guided anterior approach  Arthrogram: No  Medications: 4 mL lidocaine  1 % Outcome: tolerated well, no immediate complications Procedure, treatment alternatives, risks and benefits explained, specific risks discussed. Consent was given by the patient. Immediately prior to procedure a time out was called to verify the correct patient, procedure, equipment, support staff and site/side marked as required. Patient was prepped and draped in the usual sterile fashion.         I personally saw and evaluated the patient, and participated in the management and treatment plan.  Elspeth Parker, MD Attending Physician, Orthopedic Surgery  This document was dictated using Dragon voice recognition software. A reasonable attempt at proof reading has been made to minimize errors.

## 2024-03-20 ENCOUNTER — Encounter (HOSPITAL_BASED_OUTPATIENT_CLINIC_OR_DEPARTMENT_OTHER): Payer: Self-pay | Admitting: Physical Therapy

## 2024-03-20 ENCOUNTER — Other Ambulatory Visit (HOSPITAL_BASED_OUTPATIENT_CLINIC_OR_DEPARTMENT_OTHER): Payer: Self-pay

## 2024-03-20 ENCOUNTER — Ambulatory Visit (HOSPITAL_BASED_OUTPATIENT_CLINIC_OR_DEPARTMENT_OTHER): Admitting: Physical Therapy

## 2024-03-20 DIAGNOSIS — M25561 Pain in right knee: Secondary | ICD-10-CM

## 2024-03-20 DIAGNOSIS — R29898 Other symptoms and signs involving the musculoskeletal system: Secondary | ICD-10-CM

## 2024-03-20 DIAGNOSIS — M25661 Stiffness of right knee, not elsewhere classified: Secondary | ICD-10-CM

## 2024-03-20 DIAGNOSIS — R2689 Other abnormalities of gait and mobility: Secondary | ICD-10-CM

## 2024-03-20 DIAGNOSIS — M6281 Muscle weakness (generalized): Secondary | ICD-10-CM

## 2024-03-20 MED ORDER — FLUZONE HIGH-DOSE 0.5 ML IM SUSY
0.5000 mL | PREFILLED_SYRINGE | Freq: Once | INTRAMUSCULAR | 0 refills | Status: AC
  Filled 2024-03-20: qty 0.5, 1d supply, fill #0

## 2024-03-20 NOTE — Therapy (Signed)
 OUTPATIENT PHYSICAL THERAPY TREATMENT   Patient Name: Theresa Rivera MRN: [id number] DOB:Nov 14, 1960, 63 y.o., female Today's Date: 03/21/2024  END OF SESSION:  PT End of Session - 03/20/24 1225     Visit Number 11    Number of Visits 24    Date for Recertification  04/27/24    Authorization Type Medicare    PT Start Time 1145    PT Stop Time 1228    PT Time Calculation (min) 43 min    Activity Tolerance Patient tolerated treatment well    Behavior During Therapy [facility] for tasks assessed/performed           Progress Note   Reporting Period 02/03/2024 to 03/15/2024   See note below for Objective Data and Assessment of Progress/Goals   History reviewed. No pertinent past medical history. Past Surgical History:  Procedure Laterality Date   KNEE ARTHROSCOPY WITH MENISCAL REPAIR Right 01/31/2024   Procedure: ARTHROSCOPY, KNEE, WITH MENISCUS REPAIR;  Surgeon: Genelle Standing, MD;  Location: [facility];  Service: Orthopedics;  Laterality: Right;   RHINOPLASTY  1987   Patient Active Problem List   Diagnosis Date Noted   Acute medial meniscus tear of right knee 01/31/2024   Urticaria 05/20/2022   Hyperglycemia 04/20/2022   Cold sore 04/18/2021   Dyslipidemia 04/18/2021   Other allergic rhinitis 04/12/2013    PCP: Kennyth Worth HERO, MD  REFERRING PROVIDER: Genelle Standing, MD  REFERRING DIAG: [phone] (ICD-10-CM) - Acute medial meniscus tear of right knee, initial encounter; Right knee medial medial repair DOS 01/31/24  THERAPY DIAG:  Right knee pain, unspecified chronicity  Other abnormalities of gait and mobility  Stiffness of right knee, not elsewhere classified  Muscle weakness (generalized)  Other symptoms and signs involving the musculoskeletal system  Rationale for Evaluation and Treatment: Rehabilitation  ONSET DATE: DOS 01/31/24  SUBJECTIVE:   SUBJECTIVE STATEMENT: The patient has been to the MD. He gave her an injection. She did  not feel any major difference. She is doing well overall.   EVAL: Patient states finally slept better last night. Loves playing golf but hasn't been able to in the last few months because of this. Golf's right handed. Active prior to injury.  PERTINENT HISTORY: N/a PAIN:  Are you having pain? Yes: NPRS scale: 2/10 at rest, 4/10 with walking Pain location: R knee Pain description: achy/stabbing/throbbing  Aggravating factors: walking, being up on it/WB  Relieving factors: rest, ibuprofen /tylenol , ice   PRECAUTIONS: Other: Right knee medial medial repai  WEIGHT BEARING RESTRICTIONS: WBAT  FALLS:  Has patient fallen in last 6 months? No   PLOF: Independent  PATIENT GOALS: Get back to golf and play with grand kids  OBJECTIVE: (objective measures from initial evaluation unless otherwise dated)  OBSERVATION:  sutures intact, no s/s of infection  PATIENT SURVEYS:  LEFS  Extreme difficulty/unable (0), Quite a bit of difficulty (1), Moderate difficulty (2), Little difficulty (3), No difficulty (4) Survey date:  02/03/24   Any of your usual work, housework or school activities 1   2. Usual hobbies, recreational or sporting activities 0   3. Getting into/out of the bath 3   4. Walking between rooms 4   5. Putting on socks/shoes 4   6. Squatting  2   7. Lifting an object, like a bag of groceries from the floor 2   8. Performing light activities around your home 2   9. Performing heavy activities around your home 3   10. Getting into/out  of a car 4   11. Walking 2 blocks 1   12. Walking 1 mile 0   13. Going up/down 10 stairs (1 flight) 2   14. Standing for 1 hour 3   15.  sitting for 1 hour 4   16. Running on even ground 0   17. Running on uneven ground 0   18. Making sharp turns while running fast 0   19. Hopping  0   20. Rolling over in bed 2   Score total:  34/80 63/80     COGNITION: Overall cognitive status: Within functional limits for tasks  assessed     SENSATION: WFL  EDEMA:  Gross edema R knee  POSTURE: No Significant postural limitations  PALPATION: Hypomobile patellar glides  LOWER EXTREMITY ROM:  Active ROM Right eval Left eval Right 02/07/24 Right 02/29/24 Right  9/24   Hip flexion        Hip extension        Hip abduction        Hip adduction        Hip internal rotation        Hip external rotation        Knee flexion 120 >130 140 Seated 135* 132   Knee extension 0 0 0 Seated 11*, supine 2* 0   Ankle dorsiflexion        Ankle plantarflexion        Ankle inversion        Ankle eversion         (Blank rows = not tested) *= pain/symptoms  LOWER EXTREMITY MMT: good quad set, very mild extensor lag with reps of SLR  MMT Right eval Right 02/29/24 Right 9/24 Left 9/24  Hip flexion   27.9 22.4  Hip extension      Hip abduction   38.6 32.6  Hip adduction      Hip internal rotation      Hip external rotation      Knee flexion  4    Knee extension  4- some medial knee pain     Ankle dorsiflexion      Ankle plantarflexion      Ankle inversion      Ankle eversion       (Blank rows = not tested) *= pain/symptoms   FUNCTIONAL TESTS:  5 times sit to stand: 5.41 seconds (9/24) NT due to post op status  GAIT: Distance walked: 100 feet Assistive device utilized: None Level of assistance: Complete Independence Comments: minimally antalgic and minimally lacking flexion/ext during gait cycle   TODAY'S TREATMENT:                                                                                                                              DATE:  03/20/2024 Manual:  Trigger point releas to IT band and hamstrings Grade II and III PA and AP mobilization   There-ex: SLR 3x12 2.5 lbs  LAQ 5 lbs  Leg press 3x12 70 lbs  Hip abduction 55 lbs 3x12  Hamstrings curl 3x12 20 lbs    Neuro-re-ed  Cable walk 15 lbs x10    03/15/24   There-ex: SLR 3x12 3# ankle weight  Prone hip extension with knee  flexed 2x15ea LAQ x10 2.5#, 2x10 5#  Neuro-re-ed:  Bridge 5 2x15 Side stepping in hall with GTB at thighs-cues for glute med activation and hip orientation x4laps at rail, progressed to BTB x14 laps   Manual: STM to adductors/medial HS  03/13/24 Sci fit bike L4 x58min PROM R knee STM to adductors/medial HS SLR 2x10  Bridge 5 2x15 S/ hip abduction 2x15bil Prone hip extension with knee flexed 2x15ea LAQ with VMO sqz 2.5# 3 2x10 Sit to stands 2x10 Seated HSC GTB 2x15 Shuttle press single leg 75# 3x10ea Side stepping in hall with GTB at thighs-cues for glute med activation and hip orientation x4laps at rail -Partial lunges x10ea -Sumo squats 2x10 -single HR (bothered knee so stopped)  9/19 Manual: Trigger point release to quad  Reviewed scar tissue release to ports  Patella mobilization   There-ex:  SLR x12 RPE of 3  2x12 1 lb RPE of 6   SAQ  2x12 2 lbs RPE of 3  X12 2.5 lbs   LAQ  3x12  3 lbs   Step up:  2 inch 2x10  Lateral step up 2x10 2 inch   PATIENT EDUCATION:  Education details: Patient educated on exam findings, POC, scope of PT, HEP, relevant anatomy and biomechanics, and protocol/precautions. Person educated: Patient Education method: Explanation, Demonstration, and Handouts Education comprehension: verbalized understanding, returned demonstration, verbal cues required, and tactile cues required  HOME EXERCISE PROGRAM: Access Code: [email or web address] URL: [email or web address] Date: 02/03/2024 Prepared by: Prentice Zaunegger  Exercises - Long Sitting Quad Set  - 5 x daily - 7 x weekly - 2 sets - 10 reps - 10 second hold - Supine Heel Slide with Strap  - 5 x daily - 7 x weekly - 10 reps - 10 second hold - Active Straight Leg Raise with Quad Set  - 5 x daily - 7 x weekly - 2 sets - 10 reps - Long Sitting Calf Stretch with Strap  - 1 x daily - 7 x weekly - 3 reps - 20 second hold - Seated Ankle Pumps  - 5 x daily - 7 x weekly - 20  reps  ASSESSMENT:  CLINICAL IMPRESSION: The patient tolerated treatment well. She was able to work into gym exercises today. She tolerated well. We reviewed how to use RPE to grade her weights. We continue to use manual therapy to reduce IT band tightness and hamstring tightness. She will begin working into her gym exercises if she does well later. Therapy will continue to progress as tolerated.   OBJECTIVE IMPAIRMENTS: Abnormal gait, decreased activity tolerance, decreased balance, decreased endurance, decreased mobility, difficulty walking, decreased ROM, decreased strength, increased muscle spasms, impaired flexibility, improper body mechanics, and pain  ACTIVITY LIMITATIONS: lifting, bending, standing, squatting, stairs, transfers, bed mobility, bathing, hygiene/grooming, locomotion level, and caring for others  PARTICIPATION LIMITATIONS: meal prep, cleaning, laundry, driving, shopping, community activity, occupation, and yard work  PERSONAL FACTORS: Time since onset of injury/illness/exacerbation are also affecting patient's functional outcome.   REHAB POTENTIAL: Good  CLINICAL DECISION MAKING: Stable/uncomplicated  EVALUATION COMPLEXITY: Low  GOALS: Goals reviewed with patient? Yes  SHORT TERM GOALS: Target date: 03/16/2024    Patient will be independent with HEP in  order to improve functional outcomes. Baseline: Goal status: MET 02/29/24  2.  Patient will report at least 25% improvement in symptoms for improved quality of life. Baseline: Goal status: MET 03/15/24  3.  Patient will demonstrate knee ROM from 0 to 135 for improved mechanics with ADL.  Baseline:  Goal status: MET 02/29/24  4.  Patient able to tolerate basic closed chain exercises without joint irritation for ability to improve quad strength. Baseline:  Goal status: MET 03/15/24    LONG TERM GOALS: Target date: 04/27/2024    Patient will report at least 75% improvement in symptoms for improved quality of  life. Baseline:  Goal status: MET 03/15/24  2.  Patient will improve LEFS score by at least 24 points in order to indicate improved tolerance to activity. Baseline:  Goal status: MET 03/15/24  3.  Patient will be able to perform forward step down test without deviation in order to demonstrate improved LE strength and motor control.  Baseline:  Goal status: IN PROGRESS 03/15/24  4.  Patient will demonstrate MMT 80% of contralateral lower extremity in all tested musculature as evidence of improved strength to assist with stair ambulation and gait. Baseline:  Goal status: IN PROGRESS 03/15/24  5.  Patient will be able to return to all activities unrestricted for improved ability to perform chores and participate with family.  Baseline:  Goal status: IN PROGRESS 03/15/24     PLAN:  PT FREQUENCY: 1-2x/week  PT DURATION: 12 weeks  PLANNED INTERVENTIONS: 97164- PT Re-evaluation, 97110-Therapeutic exercises, 97530- Therapeutic activity, 97112- Neuromuscular re-education, 97535- Self Care, [id number]- Manual therapy, [phone]- Gait training, [phone]- Orthotic Fit/training, [phone]- Canalith repositioning, [id number]- Aquatic Therapy, [phone]- Splinting, [phone]- Wound care (first 20 sq cm), 97598- Wound care (each additional 20 sq cm)Patient/Family education, Balance training, Stair training, Taping, Dry Needling, Joint mobilization, Joint manipulation, Spinal manipulation, Spinal mobilization, Scar mobilization, and DME instructions.  PLAN FOR NEXT SESSION: progress with mensicus repair protocol, 4 weeks out as of 02/28/24  Sees MD 03/17/24    03/21/24 10:53 AM

## 2024-03-21 ENCOUNTER — Encounter (HOSPITAL_BASED_OUTPATIENT_CLINIC_OR_DEPARTMENT_OTHER): Payer: Self-pay | Admitting: Physical Therapy

## 2024-03-21 ENCOUNTER — Encounter (HOSPITAL_BASED_OUTPATIENT_CLINIC_OR_DEPARTMENT_OTHER): Admitting: Physical Therapy

## 2024-03-23 ENCOUNTER — Encounter (HOSPITAL_BASED_OUTPATIENT_CLINIC_OR_DEPARTMENT_OTHER): Payer: Self-pay | Admitting: Physical Therapy

## 2024-03-23 ENCOUNTER — Ambulatory Visit (HOSPITAL_BASED_OUTPATIENT_CLINIC_OR_DEPARTMENT_OTHER): Attending: Orthopaedic Surgery | Admitting: Physical Therapy

## 2024-03-23 DIAGNOSIS — M25561 Pain in right knee: Secondary | ICD-10-CM | POA: Diagnosis present

## 2024-03-23 DIAGNOSIS — M6281 Muscle weakness (generalized): Secondary | ICD-10-CM | POA: Diagnosis present

## 2024-03-23 DIAGNOSIS — M25661 Stiffness of right knee, not elsewhere classified: Secondary | ICD-10-CM | POA: Insufficient documentation

## 2024-03-23 DIAGNOSIS — R2689 Other abnormalities of gait and mobility: Secondary | ICD-10-CM | POA: Insufficient documentation

## 2024-03-23 DIAGNOSIS — R29898 Other symptoms and signs involving the musculoskeletal system: Secondary | ICD-10-CM | POA: Diagnosis present

## 2024-03-23 NOTE — Therapy (Addendum)
 OUTPATIENT PHYSICAL THERAPY TREATMENT   Patient Name: Theresa Rivera MRN: [id number] DOB:12-03-1960, 63 y.o., female Today's Date: 03/23/2024  END OF SESSION:  PT End of Session - 03/23/24 0927     Visit Number 12    Number of Visits 24    Date for Recertification  04/27/24    Authorization Type Medicare    Progress Note Due on Visit 10    PT Start Time 0930    PT Stop Time 1010    PT Time Calculation (min) 40 min    Activity Tolerance Patient tolerated treatment well    Behavior During Therapy [facility] for tasks assessed/performed              History reviewed. No pertinent past medical history. Past Surgical History:  Procedure Laterality Date   KNEE ARTHROSCOPY WITH MENISCAL REPAIR Right 01/31/2024   Procedure: ARTHROSCOPY, KNEE, WITH MENISCUS REPAIR;  Surgeon: Genelle Standing, MD;  Location: [facility];  Service: Orthopedics;  Laterality: Right;   RHINOPLASTY  1987   Patient Active Problem List   Diagnosis Date Noted   Acute medial meniscus tear of right knee 01/31/2024   Urticaria 05/20/2022   Hyperglycemia 04/20/2022   Cold sore 04/18/2021   Dyslipidemia 04/18/2021   Other allergic rhinitis 04/12/2013    PCP: Kennyth Worth HERO, MD  REFERRING PROVIDER: Genelle Standing, MD  REFERRING DIAG: [phone] (ICD-10-CM) - Acute medial meniscus tear of right knee, initial encounter; Right knee medial medial repair DOS 01/31/24  THERAPY DIAG:  Other abnormalities of gait and mobility  Stiffness of right knee, not elsewhere classified  Other symptoms and signs involving the musculoskeletal system  Muscle weakness (generalized)  Right knee pain, unspecified chronicity  Rationale for Evaluation and Treatment: Rehabilitation  ONSET DATE: DOS 01/31/24  SUBJECTIVE:   SUBJECTIVE STATEMENT: Patient reports there is still come pain in her knee. States the injection did not help much.   EVAL: Patient states finally slept better last night. Loves  playing golf but hasn't been able to in the last few months because of this. Golf's right handed. Active prior to injury.  PERTINENT HISTORY: N/a PAIN:  Are you having pain? Yes: NPRS scale: 2/10 at rest, 4/10 with walking Pain location: R knee Pain description: achy/stabbing/throbbing  Aggravating factors: walking, being up on it/WB  Relieving factors: rest, ibuprofen /tylenol , ice   PRECAUTIONS: Other: Right knee medial medial repai  WEIGHT BEARING RESTRICTIONS: WBAT  FALLS:  Has patient fallen in last 6 months? No   PLOF: Independent  PATIENT GOALS: Get back to golf and play with grand kids  OBJECTIVE: (objective measures from initial evaluation unless otherwise dated)  OBSERVATION:  sutures intact, no s/s of infection  PATIENT SURVEYS:  LEFS  Extreme difficulty/unable (0), Quite a bit of difficulty (1), Moderate difficulty (2), Little difficulty (3), No difficulty (4) Survey date:  02/03/24   Any of your usual work, housework or school activities 1   2. Usual hobbies, recreational or sporting activities 0   3. Getting into/out of the bath 3   4. Walking between rooms 4   5. Putting on socks/shoes 4   6. Squatting  2   7. Lifting an object, like a bag of groceries from the floor 2   8. Performing light activities around your home 2   9. Performing heavy activities around your home 3   10. Getting into/out of a car 4   11. Walking 2 blocks 1   12. Walking 1 mile 0  13. Going up/down 10 stairs (1 flight) 2   14. Standing for 1 hour 3   15.  sitting for 1 hour 4   16. Running on even ground 0   17. Running on uneven ground 0   18. Making sharp turns while running fast 0   19. Hopping  0   20. Rolling over in bed 2   Score total:  34/80 63/80     COGNITION: Overall cognitive status: Within functional limits for tasks assessed     SENSATION: WFL  EDEMA:  Gross edema R knee  POSTURE: No Significant postural limitations  PALPATION: Hypomobile patellar  glides  LOWER EXTREMITY ROM:  Active ROM Right eval Left eval Right 02/07/24 Right 02/29/24 Right  9/24   Hip flexion        Hip extension        Hip abduction        Hip adduction        Hip internal rotation        Hip external rotation        Knee flexion 120 >130 140 Seated 135* 132   Knee extension 0 0 0 Seated 11*, supine 2* 0   Ankle dorsiflexion        Ankle plantarflexion        Ankle inversion        Ankle eversion         (Blank rows = not tested) *= pain/symptoms  LOWER EXTREMITY MMT: good quad set, very mild extensor lag with reps of SLR  MMT Right eval Right 02/29/24 Right 9/24 Left 9/24  Hip flexion   27.9 22.4  Hip extension      Hip abduction   38.6 32.6  Hip adduction      Hip internal rotation      Hip external rotation      Knee flexion  4    Knee extension  4- some medial knee pain     Ankle dorsiflexion      Ankle plantarflexion      Ankle inversion      Ankle eversion       (Blank rows = not tested) *= pain/symptoms   FUNCTIONAL TESTS:  5 times sit to stand: 5.41 seconds (9/24) NT due to post op status  GAIT: Distance walked: 100 feet Assistive device utilized: None Level of assistance: Complete Independence Comments: minimally antalgic and minimally lacking flexion/ext during gait cycle   TODAY'S TREATMENT:                                                                                                                              DATE:  03/23/24 STM/trigger point release to IT band, medial knee, and quads  SciFit bike 6 min lvl 5  Step ups 8 inch step 3x10 with slow lowering  Lateral step up/over 8 inch box 2x10 Lunges x10 RDL with 3# dowel 3x10  Squat taps to  elevated plinth with airex pad under L foot  2x10  03/20/2024 Manual:  Trigger point releas to IT band and hamstrings Grade II and III PA and AP mobilization  There-ex: SLR 3x12 2.5 lbs  LAQ 5 lbs  Leg press 3x12 70 lbs  Hip abduction 55 lbs 3x12  Hamstrings curl 3x12  20 lbs  Neuro-re-ed  Cable walk 15 lbs x10   03/15/24 There-ex: SLR 3x12 3# ankle weight  Prone hip extension with knee flexed 2x15ea LAQ x10 2.5#, 2x10 5# Neuro-re-ed: Bridge 5 2x15 Side stepping in hall with GTB at [employer] for glute med activation and hip orientation x4laps at rail, progressed to BTB x14 laps  Manual: STM to adductors/medial HS  03/13/24 Sci fit bike L4 x53min PROM R knee STM to adductors/medial HS SLR 2x10  Bridge 5 2x15 S/ hip abduction 2x15bil Prone hip extension with knee flexed 2x15ea LAQ with VMO sqz 2.5# 3 2x10 Sit to stands 2x10 Seated HSC GTB 2x15 Shuttle press single leg 75# 3x10ea Side stepping in hall with GTB at thighs-cues for glute med activation and hip orientation x4laps at rail -Partial lunges x10ea -Sumo squats 2x10 -single HR (bothered knee so stopped)  9/19 Manual: Trigger point release to quad  Reviewed scar tissue release to ports  Patella mobilization  There-ex:  SLR x12 RPE of 3  2x12 1 lb RPE of 6  SAQ  2x12 2 lbs RPE of 3  X12 2.5 lbs  LAQ  3x12  3 lbs  Step up:  2 inch 2x10  Lateral step up 2x10 2 inch   PATIENT EDUCATION:  Education details: Patient educated on exam findings, POC, scope of PT, HEP, relevant anatomy and biomechanics, and protocol/precautions. Person educated: Patient Education method: Explanation, Demonstration, and Handouts Education comprehension: verbalized understanding, returned demonstration, verbal cues required, and tactile cues required  HOME EXERCISE PROGRAM: Access Code: [email or web address] URL: [email or web address] Date: 02/03/2024 Prepared by: Prentice Zaunegger  Exercises - Long Sitting Quad Set  - 5 x daily - 7 x weekly - 2 sets - 10 reps - 10 second hold - Supine Heel Slide with Strap  - 5 x daily - 7 x weekly - 10 reps - 10 second hold - Active Straight Leg Raise with Quad Set  - 5 x daily - 7 x weekly - 2 sets - 10 reps - Long Sitting Calf Stretch with Strap  - 1 x  daily - 7 x weekly - 3 reps - 20 second hold - Seated Ankle Pumps  - 5 x daily - 7 x weekly - 20 reps  ASSESSMENT:  CLINICAL IMPRESSION: The patient tolerated treatment well with no significant increase in pain. Manual therapy utilized to decrease hyperactive musculature in LE and alleviate pain in medial knee. Exercises focused closed chain LE strengthening to improve activity tolerance and independence with gym routine. Educated on expected soreness. Modified squats by putting airex pad under left foot to promote increased WB through RLE because the patient was shifting to her left during squats. Pt will continue to benefit from therapy to address remaining limitations and return to golf .   OBJECTIVE IMPAIRMENTS: Abnormal gait, decreased activity tolerance, decreased balance, decreased endurance, decreased mobility, difficulty walking, decreased ROM, decreased strength, increased muscle spasms, impaired flexibility, improper body mechanics, and pain  ACTIVITY LIMITATIONS: lifting, bending, standing, squatting, stairs, transfers, bed mobility, bathing, hygiene/grooming, locomotion level, and caring for others  PARTICIPATION LIMITATIONS: meal prep, cleaning, laundry, driving, shopping, community activity, occupation, and yard work  PERSONAL  FACTORS: Time since onset of injury/illness/exacerbation are also affecting patient's functional outcome.   REHAB POTENTIAL: Good  CLINICAL DECISION MAKING: Stable/uncomplicated  EVALUATION COMPLEXITY: Low  GOALS: Goals reviewed with patient? Yes  SHORT TERM GOALS: Target date: 03/16/2024    Patient will be independent with HEP in order to improve functional outcomes. Baseline: Goal status: MET 02/29/24  2.  Patient will report at least 25% improvement in symptoms for improved quality of life. Baseline: Goal status: MET 03/15/24  3.  Patient will demonstrate knee ROM from 0 to 135 for improved mechanics with ADL.  Baseline:  Goal status: MET  02/29/24  4.  Patient able to tolerate basic closed chain exercises without joint irritation for ability to improve quad strength. Baseline:  Goal status: MET 03/15/24   LONG TERM GOALS: Target date: 04/27/2024    Patient will report at least 75% improvement in symptoms for improved quality of life. Baseline:  Goal status: MET 03/15/24  2.  Patient will improve LEFS score by at least 24 points in order to indicate improved tolerance to activity. Baseline:  Goal status: MET 03/15/24  3.  Patient will be able to perform forward step down test without deviation in order to demonstrate improved LE strength and motor control.  Baseline:  Goal status: IN PROGRESS 03/15/24  4.  Patient will demonstrate MMT 80% of contralateral lower extremity in all tested musculature as evidence of improved strength to assist with stair ambulation and gait. Baseline:  Goal status: IN PROGRESS 03/15/24  5.  Patient will be able to return to all activities unrestricted for improved ability to perform chores and participate with family.  Baseline:  Goal status: IN PROGRESS 03/15/24  PLAN:  PT FREQUENCY: 1-2x/week  PT DURATION: 12 weeks  PLANNED INTERVENTIONS: 97164- PT Re-evaluation, 97110-Therapeutic exercises, 97530- Therapeutic activity, 97112- Neuromuscular re-education, 97535- Self Care, 02859- Manual therapy, [phone]- Gait training, [phone]- Orthotic Fit/training, [phone]- Canalith repositioning, [id number]- Aquatic Therapy, [phone]- Splinting, [phone]- Wound care (first 20 sq cm), 97598- Wound care (each additional 20 sq cm)Patient/Family education, Balance training, Stair training, Taping, Dry Needling, Joint mobilization, Joint manipulation, Spinal manipulation, Spinal mobilization, Scar mobilization, and DME instructions.  PLAN FOR NEXT SESSION: progress with mensicus repair protocol, 4 weeks out as of 02/28/24  Lili Finder, Student-PT 03/23/2024, 10:12 AM   This entire session was performed under direct  supervision and direction of a licensed therapist/therapist assistant . I have personally read, edited and approve of the note as written. 11:04 AM, 03/23/24 Prentice CANDIE Stains PT, DPT Physical Therapist at [facility]

## 2024-03-27 ENCOUNTER — Other Ambulatory Visit (HOSPITAL_BASED_OUTPATIENT_CLINIC_OR_DEPARTMENT_OTHER): Payer: Self-pay | Admitting: Orthopaedic Surgery

## 2024-03-27 ENCOUNTER — Encounter (HOSPITAL_BASED_OUTPATIENT_CLINIC_OR_DEPARTMENT_OTHER): Payer: Self-pay | Admitting: Orthopaedic Surgery

## 2024-03-27 DIAGNOSIS — S83241A Other tear of medial meniscus, current injury, right knee, initial encounter: Secondary | ICD-10-CM

## 2024-03-27 NOTE — Telephone Encounter (Signed)
 Mri ordered

## 2024-03-28 ENCOUNTER — Encounter (HOSPITAL_BASED_OUTPATIENT_CLINIC_OR_DEPARTMENT_OTHER): Admitting: Physical Therapy

## 2024-03-28 ENCOUNTER — Other Ambulatory Visit (HOSPITAL_BASED_OUTPATIENT_CLINIC_OR_DEPARTMENT_OTHER): Payer: Self-pay

## 2024-03-28 MED ORDER — COMIRNATY 30 MCG/0.3ML IM SUSY
0.3000 mL | PREFILLED_SYRINGE | Freq: Once | INTRAMUSCULAR | 0 refills | Status: AC
  Filled 2024-03-28: qty 0.3, 1d supply, fill #0

## 2024-03-29 ENCOUNTER — Inpatient Hospital Stay: Admission: RE | Admit: 2024-03-29 | Discharge: 2024-03-29 | Attending: Orthopaedic Surgery

## 2024-03-29 DIAGNOSIS — S83241A Other tear of medial meniscus, current injury, right knee, initial encounter: Secondary | ICD-10-CM

## 2024-03-30 ENCOUNTER — Encounter (HOSPITAL_BASED_OUTPATIENT_CLINIC_OR_DEPARTMENT_OTHER): Admitting: Physical Therapy

## 2024-03-30 ENCOUNTER — Other Ambulatory Visit (HOSPITAL_BASED_OUTPATIENT_CLINIC_OR_DEPARTMENT_OTHER): Payer: Self-pay | Admitting: Orthopaedic Surgery

## 2024-03-30 DIAGNOSIS — M810 Age-related osteoporosis without current pathological fracture: Secondary | ICD-10-CM

## 2024-03-30 DIAGNOSIS — S83241A Other tear of medial meniscus, current injury, right knee, initial encounter: Secondary | ICD-10-CM

## 2024-04-03 ENCOUNTER — Ambulatory Visit: Admitting: Physician Assistant

## 2024-04-03 ENCOUNTER — Encounter: Payer: Self-pay | Admitting: Physician Assistant

## 2024-04-03 VITALS — Ht 66.0 in | Wt 164.6 lb

## 2024-04-03 DIAGNOSIS — M810 Age-related osteoporosis without current pathological fracture: Secondary | ICD-10-CM | POA: Insufficient documentation

## 2024-04-03 NOTE — Progress Notes (Signed)
 Office Visit Note   Patient: Theresa Rivera           Date of Birth: 11-21-1960           MRN: [id number] Visit Date: 04/03/2024              Requested by: Genelle Standing, MD [address] [place],  [place] [address] PCP: Kennyth Worth HERO, MD   Assessment & Plan: Visit Diagnoses:  1. Age-related osteoporosis without current pathological fracture     Plan: Patient is a pleasant 63 year old woman who comes in today referral from Dr. Felipe for evaluation of osteoporosis.  She has not had a bone density in quite a while.  She has had a recent meniscal repair and sustained a stress fracture which was thought maybe to be initiated by one of the anchors but is very unusual..  She is very healthy she does not take any medication for osteoporosis she has no history of fractures except for this current stress fracture.  No coronary artery disease no history of cancers or kidney disease.  She has no GI issues including ulcers bypass surgery or reflux.  No history of epilepsy.  She did go through menopause at age 31 did not do hormone replacement therapy.  She has taken multivitamins in the past not consistently.  Unsure how much calcium vitamin D here than it.  She drinks less than 1 alcoholic beverage a day.  Normally she golfs and goes to the gym unfortunately because of her current recovery she has not been able to do this.  She does have a history of a hip fracture in her mom as well as a shoulder fracture.  I spent 40 minutes looking at her chart discussing options with her.  I really do need to get a little bit more information.  I would like to draw a vitamin D.  Also asked her to track her calcium intake.  We will get her bone density scan.  Follow-up once bone density is completed.  Follow-Up Instructions: After bone scan  Orders:  No orders of the defined types were placed in this encounter.  No orders of the defined types were placed in this encounter.      Procedures: No procedures performed   Clinical Data: No additional findings.   Subjective: No chief complaint on file.   HPI patient is a pleasant 63 year old woman who comes in today in referral from Dr. Genelle for evaluation of osteoporosis  Review of Systems  All other systems reviewed and are negative.    Objective: Vital Signs: There were no vitals taken for this visit.  Physical Exam Constitutional:      Appearance: Normal appearance.  Pulmonary:     Effort: Pulmonary effort is normal.  Skin:    General: Skin is warm and dry.  Neurological:     General: No focal deficit present.     Mental Status: She is alert and oriented to person, place, and time.  Psychiatric:        Mood and Affect: Mood normal.        Behavior: Behavior normal.     Ortho Exam  Specialty Comments:  No specialty comments available.  Imaging: No results found.   PMFS History: Patient Active Problem List   Diagnosis Date Noted   Age-related osteoporosis without current pathological fracture 04/03/2024   Acute medial meniscus tear of right knee 01/31/2024   Urticaria 05/20/2022   Hyperglycemia 04/20/2022   Cold  sore 04/18/2021   Dyslipidemia 04/18/2021   Other allergic rhinitis 04/12/2013   History reviewed. No pertinent past medical history.  Family History  Problem Relation Age of Onset   Prostate cancer Father    Breast cancer Maternal Grandmother 80   Eczema Son     Past Surgical History:  Procedure Laterality Date   KNEE ARTHROSCOPY WITH MENISCAL REPAIR Right 01/31/2024   Procedure: ARTHROSCOPY, KNEE, WITH MENISCUS REPAIR;  Surgeon: Genelle Standing, MD;  Location: [facility];  Service: Orthopedics;  Laterality: Right;   RHINOPLASTY  1987   Social History   Occupational History   Not on file  Tobacco Use   Smoking status: Never   Smokeless tobacco: Never  Vaping Use   Vaping status: Never Used  Substance and Sexual Activity   Alcohol use: Yes     Alcohol/week: 7.0 standard drinks of alcohol    Types: 7 Glasses of wine per week    Comment: occasional   Drug use: Never   Sexual activity: Yes

## 2024-04-03 NOTE — Addendum Note (Signed)
 Addended by: MYRNA VEVA HERO on: 04/03/2024 03:18 PM   Modules accepted: Orders

## 2024-04-04 ENCOUNTER — Other Ambulatory Visit: Payer: Self-pay | Admitting: Physician Assistant

## 2024-04-04 ENCOUNTER — Encounter (HOSPITAL_BASED_OUTPATIENT_CLINIC_OR_DEPARTMENT_OTHER): Admitting: Physical Therapy

## 2024-04-04 LAB — VITAMIN D 25 HYDROXY (VIT D DEFICIENCY, FRACTURES): Vit D, 25-Hydroxy: 28 ng/mL — ABNORMAL LOW (ref 30–100)

## 2024-04-04 MED ORDER — VITAMIN D (ERGOCALCIFEROL) 1.25 MG (50000 UNIT) PO CAPS: 50000.0000 [IU] | ORAL_CAPSULE | ORAL | 0 refills | Status: DC

## 2024-04-06 ENCOUNTER — Encounter (HOSPITAL_BASED_OUTPATIENT_CLINIC_OR_DEPARTMENT_OTHER): Admitting: Physical Therapy

## 2024-04-10 ENCOUNTER — Other Ambulatory Visit (HOSPITAL_BASED_OUTPATIENT_CLINIC_OR_DEPARTMENT_OTHER): Payer: Self-pay

## 2024-04-10 ENCOUNTER — Other Ambulatory Visit (HOSPITAL_BASED_OUTPATIENT_CLINIC_OR_DEPARTMENT_OTHER): Payer: Self-pay | Admitting: Orthopaedic Surgery

## 2024-04-10 ENCOUNTER — Other Ambulatory Visit: Payer: Self-pay

## 2024-04-10 MED ORDER — MELOXICAM 15 MG PO TABS
15.0000 mg | ORAL_TABLET | Freq: Every day | ORAL | 0 refills | Status: DC
  Filled 2024-04-10: qty 30, 30d supply, fill #0

## 2024-04-10 MED ORDER — CALCITONIN (SALMON) 200 UNIT/ACT NA SOLN
1.0000 | Freq: Every day | NASAL | 0 refills | Status: AC
  Filled 2024-04-10: qty 3.7, 30d supply, fill #0

## 2024-04-11 ENCOUNTER — Encounter (HOSPITAL_BASED_OUTPATIENT_CLINIC_OR_DEPARTMENT_OTHER): Admitting: Physical Therapy

## 2024-04-13 ENCOUNTER — Encounter (HOSPITAL_BASED_OUTPATIENT_CLINIC_OR_DEPARTMENT_OTHER): Admitting: Physical Therapy

## 2024-04-17 ENCOUNTER — Ambulatory Visit (HOSPITAL_BASED_OUTPATIENT_CLINIC_OR_DEPARTMENT_OTHER)
Admission: RE | Admit: 2024-04-17 | Discharge: 2024-04-17 | Disposition: A | Discharge status: Home / Self Care | Source: Ambulatory Visit | Attending: Physician Assistant | Admitting: Physician Assistant

## 2024-04-17 DIAGNOSIS — M810 Age-related osteoporosis without current pathological fracture: Secondary | ICD-10-CM | POA: Insufficient documentation

## 2024-04-17 DIAGNOSIS — S83241A Other tear of medial meniscus, current injury, right knee, initial encounter: Secondary | ICD-10-CM | POA: Insufficient documentation

## 2024-04-18 ENCOUNTER — Encounter (HOSPITAL_BASED_OUTPATIENT_CLINIC_OR_DEPARTMENT_OTHER): Admitting: Physical Therapy

## 2024-04-19 ENCOUNTER — Ambulatory Visit (INDEPENDENT_AMBULATORY_CARE_PROVIDER_SITE_OTHER): Admitting: Orthopaedic Surgery

## 2024-04-19 DIAGNOSIS — S83241A Other tear of medial meniscus, current injury, right knee, initial encounter: Secondary | ICD-10-CM

## 2024-04-19 NOTE — Progress Notes (Signed)
 Post Operative Evaluation    Procedure/Date of Surgery: Right knee medial meniscal repair 8/11  Interval History:    Presents today 10 weeks status post above procedure.  At this time she is feeling much better while on the Mobic and with limited weightbearing from her stress reaction.  She has been able to see [facility] for osteoporosis management.  She has started the calcitonin   PMH/PSH/Family History/Social History/Meds/Allergies:   No past medical history on file. Past Surgical History:  Procedure Laterality Date   KNEE ARTHROSCOPY WITH MENISCAL REPAIR Right 01/31/2024   Procedure: ARTHROSCOPY, KNEE, WITH MENISCUS REPAIR;  Surgeon: Genelle Standing, MD;  Location: [facility];  Service: Orthopedics;  Laterality: Right;   RHINOPLASTY  1987   Social History   Socioeconomic History   Marital status: Married    Spouse name: Not on file   Number of children: Not on file   Years of education: Not on file   Highest education level: Master's degree (e.g., MA, MS, MEng, MEd, MSW, MBA)  Occupational History   Not on file  Tobacco Use   Smoking status: Never   Smokeless tobacco: Never  Vaping Use   Vaping status: Never Used  Substance and Sexual Activity   Alcohol use: Yes    Alcohol/week: 7.0 standard drinks of alcohol    Types: 7 Glasses of wine per week    Comment: occasional   Drug use: Never   Sexual activity: Yes  Other Topics Concern   Not on file  Social History Narrative   Not on file   Social Drivers of Health   Financial Resource Strain: Low Risk  (01/24/2024)   Overall Financial Resource Strain (CARDIA)    Difficulty of Paying Living Expenses: Not hard at all  Food Insecurity: No Food Insecurity (01/24/2024)   Hunger Vital Sign    Worried About Running Out of Food in the Last Year: Never true    Ran Out of Food in the Last Year: Never true  Transportation Needs: No Transportation Needs (01/24/2024)   PRAPARE -  Administrator, Civil Service (Medical): No    Lack of Transportation (Non-Medical): No  Physical Activity: Unknown (01/24/2024)   Exercise Vital Sign    Days of Exercise per Week: Patient declined    Minutes of Exercise per Session: Not on file  Stress: No Stress Concern Present (01/24/2024)   [employer] of Occupational Health - Occupational Stress Questionnaire    Feeling of Stress: Not at all  Social Connections: Socially Integrated (01/24/2024)   Social Connection and Isolation Panel    Frequency of Communication with Friends and Family: More than three times a week    Frequency of Social Gatherings with Friends and Family: More than three times a week    Attends Religious Services: More than 4 times per year    Active Member of [employer] or Organizations: Yes    Attends Engineer, Structural: More than 4 times per year    Marital Status: Married   Family History  Problem Relation Age of Onset   Prostate cancer Father    Breast cancer Maternal Grandmother 82   Eczema Son    No Known Allergies Current Outpatient Medications  Medication Sig Dispense Refill   meloxicam (MOBIC) 15 MG tablet Take 1 tablet (  15 mg total) by mouth daily. 30 tablet 0   Vitamin D, Ergocalciferol, (DRISDOL) 1.25 MG (50000 UNIT) CAPS capsule Take 1 capsule (50,000 Units total) by mouth every 7 (seven) days. 4 capsule 0   acyclovir  (ZOVIRAX ) 200 MG capsule Take 4 capsules (800 mg total) by mouth 2 (two) times daily. Take for 5 days as needed for flares 90 capsule 1   aspirin  EC 325 MG tablet Take 1 tablet (325 mg total) by mouth daily. 14 tablet 0   calcitonin, salmon, (MIACALCIN/FORTICAL) 200 UNIT/ACT nasal spray Place 1 spray into alternate nostrils daily. 3.7 mL 0   oxyCODONE  (ROXICODONE ) 5 MG immediate release tablet Take 1 tablet (5 mg total) by mouth every 4 (four) hours as needed for severe pain (pain score 7-10) or breakthrough pain. 10 tablet 0   No current facility-administered  medications for this visit.   No results found.  Review of Systems:   A ROS was performed including pertinent positives and negatives as documented in the HPI.   Musculoskeletal Exam:    There were no vitals taken for this visit.  Right knee incisions are well-appearing without erythema or drainage.  Range of motion is from 0 to 120 degrees.  There is some periportal tenderness  Imaging:      I personally reviewed and interpreted the radiographs.   Assessment:   10 weeks status post right knee medial meniscal repair with a medial stress reaction following arthroscopic meniscal repair.  She is overall doing extremely well with limited weightbearing.  Her pain is much improved.  At this time I would like her to finish her calcitonin as well as Mobic prescription and I will plan to see her back for reassessment in 4 weeks  Plan :    - Return to clinic 4 weeks for reassessment       I personally saw and evaluated the patient, and participated in the management and treatment plan.  Elspeth Parker, MD Attending Physician, Orthopedic Surgery  This document was dictated using Dragon voice recognition software. A reasonable attempt at proof reading has been made to minimize errors.

## 2024-04-20 ENCOUNTER — Encounter (HOSPITAL_BASED_OUTPATIENT_CLINIC_OR_DEPARTMENT_OTHER): Admitting: Physical Therapy

## 2024-04-24 ENCOUNTER — Encounter: Payer: Self-pay | Admitting: Radiology

## 2024-04-24 ENCOUNTER — Encounter: Payer: 59 | Admitting: Family Medicine

## 2024-04-25 ENCOUNTER — Encounter (HOSPITAL_BASED_OUTPATIENT_CLINIC_OR_DEPARTMENT_OTHER): Admitting: Physical Therapy

## 2024-04-27 ENCOUNTER — Encounter (HOSPITAL_BASED_OUTPATIENT_CLINIC_OR_DEPARTMENT_OTHER): Admitting: Physical Therapy

## 2024-05-01 ENCOUNTER — Other Ambulatory Visit: Payer: Self-pay | Admitting: Family Medicine

## 2024-05-01 DIAGNOSIS — Z1231 Encounter for screening mammogram for malignant neoplasm of breast: Secondary | ICD-10-CM

## 2024-05-02 ENCOUNTER — Ambulatory Visit (INDEPENDENT_AMBULATORY_CARE_PROVIDER_SITE_OTHER)

## 2024-05-02 DIAGNOSIS — M810 Age-related osteoporosis without current pathological fracture: Secondary | ICD-10-CM

## 2024-05-02 NOTE — Progress Notes (Signed)
Patient is here for lab draw only

## 2024-05-03 ENCOUNTER — Ambulatory Visit (INDEPENDENT_AMBULATORY_CARE_PROVIDER_SITE_OTHER): Admitting: Orthopaedic Surgery

## 2024-05-03 ENCOUNTER — Encounter (HOSPITAL_BASED_OUTPATIENT_CLINIC_OR_DEPARTMENT_OTHER): Admitting: Orthopaedic Surgery

## 2024-05-03 ENCOUNTER — Other Ambulatory Visit (HOSPITAL_BASED_OUTPATIENT_CLINIC_OR_DEPARTMENT_OTHER): Payer: Self-pay | Admitting: Orthopaedic Surgery

## 2024-05-03 DIAGNOSIS — S83241A Other tear of medial meniscus, current injury, right knee, initial encounter: Secondary | ICD-10-CM

## 2024-05-03 LAB — VITAMIN D 25 HYDROXY (VIT D DEFICIENCY, FRACTURES): Vit D, 25-Hydroxy: 47 ng/mL (ref 30–100)

## 2024-05-03 NOTE — Progress Notes (Signed)
 Post Operative Evaluation    Procedure/Date of Surgery: Right knee medial meniscal repair 8/11  Interval History:    Presents today status post the above procedure.  Unfortunately now that she has been weightbearing she is having persistent pain and swelling now about the medial and lateral joint line   PMH/PSH/Family History/Social History/Meds/Allergies:   No past medical history on file. Past Surgical History:  Procedure Laterality Date   KNEE ARTHROSCOPY WITH MENISCAL REPAIR Right 01/31/2024   Procedure: ARTHROSCOPY, KNEE, WITH MENISCUS REPAIR;  Surgeon: Genelle Standing, MD;  Location: [facility];  Service: Orthopedics;  Laterality: Right;   RHINOPLASTY  1987   Social History   Socioeconomic History   Marital status: Married    Spouse name: Not on file   Number of children: Not on file   Years of education: Not on file   Highest education level: Master's degree (e.g., MA, MS, MEng, MEd, MSW, MBA)  Occupational History   Not on file  Tobacco Use   Smoking status: Never   Smokeless tobacco: Never  Vaping Use   Vaping status: Never Used  Substance and Sexual Activity   Alcohol use: Yes    Alcohol/week: 7.0 standard drinks of alcohol    Types: 7 Glasses of wine per week    Comment: occasional   Drug use: Never   Sexual activity: Yes  Other Topics Concern   Not on file  Social History Narrative   Not on file   Social Drivers of Health   Financial Resource Strain: Low Risk  (01/24/2024)   Overall Financial Resource Strain (CARDIA)    Difficulty of Paying Living Expenses: Not hard at all  Food Insecurity: No Food Insecurity (01/24/2024)   Hunger Vital Sign    Worried About Running Out of Food in the Last Year: Never true    Ran Out of Food in the Last Year: Never true  Transportation Needs: No Transportation Needs (01/24/2024)   PRAPARE - Administrator, Civil Service (Medical): No    Lack of Transportation  (Non-Medical): No  Physical Activity: Unknown (01/24/2024)   Exercise Vital Sign    Days of Exercise per Week: Patient declined    Minutes of Exercise per Session: Not on file  Stress: No Stress Concern Present (01/24/2024)   [employer] of Occupational Health - Occupational Stress Questionnaire    Feeling of Stress: Not at all  Social Connections: Socially Integrated (01/24/2024)   Social Connection and Isolation Panel    Frequency of Communication with Friends and Family: More than three times a week    Frequency of Social Gatherings with Friends and Family: More than three times a week    Attends Religious Services: More than 4 times per year    Active Member of [employer] or Organizations: Yes    Attends Engineer, Structural: More than 4 times per year    Marital Status: Married   Family History  Problem Relation Age of Onset   Prostate cancer Father    Breast cancer Maternal Grandmother 61   Eczema Son    No Known Allergies Current Outpatient Medications  Medication Sig Dispense Refill   meloxicam (MOBIC) 15 MG tablet Take 1 tablet (15 mg total) by mouth daily. 30 tablet 0   Vitamin D, Ergocalciferol, (DRISDOL) 1.25  MG (50000 UNIT) CAPS capsule Take 1 capsule (50,000 Units total) by mouth every 7 (seven) days. 4 capsule 0   acyclovir  (ZOVIRAX ) 200 MG capsule Take 4 capsules (800 mg total) by mouth 2 (two) times daily. Take for 5 days as needed for flares 90 capsule 1   aspirin  EC 325 MG tablet Take 1 tablet (325 mg total) by mouth daily. 14 tablet 0   calcitonin, salmon, (MIACALCIN/FORTICAL) 200 UNIT/ACT nasal spray Place 1 spray into alternate nostrils daily. 3.7 mL 0   oxyCODONE  (ROXICODONE ) 5 MG immediate release tablet Take 1 tablet (5 mg total) by mouth every 4 (four) hours as needed for severe pain (pain score 7-10) or breakthrough pain. 10 tablet 0   No current facility-administered medications for this visit.   No results found.  Review of Systems:   A ROS was  performed including pertinent positives and negatives as documented in the HPI.   Musculoskeletal Exam:    There were no vitals taken for this visit.  Right knee incisions are well-appearing without erythema or drainage.  Range of motion is from 0 to 120 degrees.  There is some periportal tenderness  Imaging:      I personally reviewed and interpreted the radiographs.   Assessment:   12 weeks status post right knee medial meniscal repair with a medial stress reaction following arthroscopic meniscal repair.  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  Unfortunately she is also having lateral joint line symptoms as well now.  Given this and the fact that she is not recovering and if anything has been having more pain unfortunately I do believe that she would ideally benefit from total knee arthroplasty.  Will plan for referral to Dr. Vernetta for this  Plan :    - Return to clinic as needed       I personally saw and evaluated the patient, and participated in the management and treatment plan.  Elspeth Parker, MD Attending Physician, Orthopedic Surgery  This document was dictated using Dragon voice recognition software. A reasonable attempt at proof reading has been made to minimize errors.

## 2024-05-31 ENCOUNTER — Encounter: Payer: Self-pay | Admitting: Orthopaedic Surgery

## 2024-05-31 ENCOUNTER — Ambulatory Visit (INDEPENDENT_AMBULATORY_CARE_PROVIDER_SITE_OTHER): Admitting: Orthopaedic Surgery

## 2024-05-31 VITALS — Ht 66.0 in | Wt 164.0 lb

## 2024-05-31 DIAGNOSIS — M1712 Unilateral primary osteoarthritis, left knee: Secondary | ICD-10-CM | POA: Insufficient documentation

## 2024-05-31 DIAGNOSIS — G8929 Other chronic pain: Secondary | ICD-10-CM | POA: Diagnosis not present

## 2024-05-31 DIAGNOSIS — M25562 Pain in left knee: Secondary | ICD-10-CM

## 2024-05-31 NOTE — Progress Notes (Signed)
 The patient is a very pleasant 63 year old who has been dealing with knee pain since before April of this year.  She had an injury to the knee.  She is an avid teacher, english as a foreign language.  She is very healthy and is not immunocompromise in any way.  She is not obese and not a diabetic.  She eventually was found to have a complex posterior horn to mid body medial meniscal tear and in August of this year was taken to surgery for a left knee arthroscopy with a partial medial meniscectomy.  This was performed by Dr. Genelle my partner.  She unfortunately developed significant knee pain after that and a MRI was obtained and surprising it shows now significant subchondral edema in the medial femoral condyle area and the medial tibial plateau over a wide extensive area.  There is also patellofemoral arthritic changes and some areas of full-thickness cartilage loss.  At this point her left knee pain is daily and it is 10 out of 10.  It is causing her to walk with a limp.  It is detrimentally fighting her mobility, her quality of life and her actives daily living to the point of wishing to proceed with knee replacement surgery.  She has been through physical therapy and work on quad strengthening exercises as well.  She did see one of our colleagues in town who felt that she should wait for a year after this knee arthroscopy due to the potential for developing scar tissue or infection.  However again she is not immunocompromise in any way.  On examination of the right knee today she is walking with a limp.  She has significant medial joint line tenderness and patellofemoral tenderness and crepitation.  There is slight varus malalignment that is easily correctable.  I went over all the imaging studies with her.  She has dramatic changes from her MRI earlier this year to the more recent MRI showing significant and extensive subchondral edema in the medial femoral condyle and medial tibial plateau.  There is also significant arthritic changes  throughout the medial compartment the knee and the patellofemoral joint.  We had a long and thorough discussion about knee replacement surgery.  She does not want to wait a year and I agree with this as well.  I think she would likely not develop a significant amount of scar tissue because she is very motivated and the knee moves well now and I do not think she she is at heightened risk of infection this far out from her knee arthroscopy that was done in August.  Her husband has had knee replacement surgery so she is fully aware of what the surgery involves.  We discussed the intraoperative and postoperative aspects of knee replacement surgery and what to expect in the long run and short run.  All questions and concerns were answered addressed.  We will work on having this scheduled for late January or early February of this next year which is just 6 weeks away.  She agrees with this as well.  All questions and concerns were addressed and answered.

## 2024-06-02 ENCOUNTER — Telehealth: Payer: Self-pay | Admitting: Family Medicine

## 2024-06-02 ENCOUNTER — Encounter: Payer: Self-pay | Admitting: Family Medicine

## 2024-06-02 NOTE — Telephone Encounter (Signed)
 [facility] faxed Surgical Clearance, to be filled out by provider. Patient requested to send it back via Fax within ASAP. Document is located in providers tray at front office.Please advise at [phone].

## 2024-06-06 NOTE — Telephone Encounter (Signed)
 Patient need an OV for surgical clearance

## 2024-06-06 NOTE — Telephone Encounter (Signed)
Form placed to be reviewed in PCP office

## 2024-06-07 NOTE — Telephone Encounter (Signed)
 Pt scheduled for 06/09/24

## 2024-06-08 ENCOUNTER — Encounter: Payer: Self-pay | Admitting: Orthopaedic Surgery

## 2024-06-08 NOTE — Telephone Encounter (Signed)
Called patient and left a voicemail for return call.

## 2024-06-09 ENCOUNTER — Ambulatory Visit: Admitting: Family Medicine

## 2024-06-12 ENCOUNTER — Encounter: Payer: Self-pay | Admitting: Orthopaedic Surgery

## 2024-06-12 NOTE — Progress Notes (Signed)
 Error

## 2024-06-19 ENCOUNTER — Ambulatory Visit
Admission: RE | Admit: 2024-06-19 | Discharge: 2024-06-19 | Disposition: A | Source: Ambulatory Visit | Attending: Family Medicine | Admitting: Family Medicine

## 2024-06-19 DIAGNOSIS — Z1231 Encounter for screening mammogram for malignant neoplasm of breast: Secondary | ICD-10-CM

## 2024-07-04 ENCOUNTER — Telehealth: Payer: Self-pay | Admitting: Orthopaedic Surgery

## 2024-07-04 NOTE — Telephone Encounter (Signed)
 Pt called stating she has surgery in a couple weeks and asking if her medications can be sent so her caregiver can get them and one less thing for her to do. Pharmacy is [facility]. Pt had surgery Aug 11. Pt states she has 11 oxy left so dont need that script just yet. Pt number is [phone].

## 2024-07-05 ENCOUNTER — Other Ambulatory Visit: Payer: Self-pay | Admitting: Orthopaedic Surgery

## 2024-07-06 NOTE — Pre-Procedure Instructions (Addendum)
 Surgical Instructions   Your procedure is scheduled on July 18, 2024. Report to [facility] Main Entrance A at 8:00 A.M., then check in with the Admitting office. Any questions or running late day of surgery: call [phone]  Questions prior to your surgery date: call [phone], Monday-Friday, 8am-4pm. If you experience any cold or flu symptoms such as cough, fever, chills, shortness of breath, etc. between now and your scheduled surgery, please notify us  at the above number.     Remember:  Do not eat after midnight the night before your surgery  You may drink clear liquids until 7:00 AM the morning of your surgery.   Clear liquids allowed are: Water, Non-Citrus Juices (without pulp), Carbonated Beverages, Clear Tea (no milk, honey, etc.), Black Coffee Only (NO MILK, CREAM OR POWDERED CREAMER of any kind), and Gatorade.    Take these medicines the morning of surgery with A SIP OF WATER: acetaminophen  (TYLENOL )    One week prior to surgery, STOP taking any Aspirin  (unless otherwise instructed by your surgeon) Aleve, Naproxen, Ibuprofen , Motrin , Advil , Goody's, BC's, all herbal medications, fish oil, and non-prescription vitamins.                     Do NOT Smoke (Tobacco/Vaping) for 24 hours prior to your procedure.  If you use a CPAP at night, you may bring your mask/headgear for your overnight stay.   You will be asked to remove any contacts, glasses, piercing's, hearing aid's, dentures/partials prior to surgery. Please bring cases for these items if needed.    Your surgeon will determine if you are to be admitted or discharged the same day.  Patients discharged the day of surgery will not be allowed to drive home, and someone needs to stay with them for 24 hours.  SURGICAL WAITING ROOM VISITATION Patients may have no more than 2 support people in the waiting area - these visitors may rotate.   Pre-op nurse will coordinate an appropriate time for 2 ADULT support persons,  who may not rotate, to accompany patient in pre-op.  Children under the age of 22 must have an adult with them who is not the patient and must remain in the main waiting area with an adult.  If the patient needs to stay at the hospital during part of their recovery, the visitor guidelines for inpatient rooms apply.  Please refer to the [facility] website for the visitor guidelines for any additional information.   If you received a COVID test during your pre-op visit  it is requested that you wear a mask when out in public, stay away from anyone that may not be feeling well and notify your surgeon if you develop symptoms. If you have been in contact with anyone that has tested positive in the last 10 days please notify you surgeon.      Pre-operative 4 CHG Bathing Instructions   You can play a key role in reducing the risk of infection after surgery. Your skin needs to be as free of germs as possible. You can reduce the number of germs on your skin by washing with CHG (chlorhexidine  gluconate) soap before surgery. CHG is an antiseptic soap that kills germs and continues to kill germs even after washing.   DO NOT use if you have an allergy  to chlorhexidine /CHG or antibacterial soaps. If your skin becomes reddened or irritated, stop using the CHG and notify one of our RNs at [phone].   Please shower with the CHG  soap starting 4 days before surgery using the following schedule:     Please keep in mind the following:  DO NOT shave, including legs and underarms, starting the day of your first shower.   You may shave your face at any point before/day of surgery.  Place clean sheets on your bed the day you start using CHG soap. Use a clean washcloth (not used since being washed) for each shower. DO NOT sleep with pets once you start using the CHG.   CHG Shower Instructions:  Wash your face and private area with normal soap. If you choose to wash your hair, wash first with your normal  shampoo.  After you use shampoo/soap, rinse your hair and body thoroughly to remove shampoo/soap residue.  Turn the water OFF and apply  bottle of CHG soap to a CLEAN washcloth.  Apply CHG soap ONLY FROM YOUR NECK DOWN TO YOUR TOES (washing for 3-5 minutes)  DO NOT use CHG soap on face, private areas, open wounds, or sores.  Pay special attention to the area where your surgery is being performed.  If you are having back surgery, having someone wash your back for you may be helpful. Wait 2 minutes after CHG soap is applied, then you may rinse off the CHG soap.  Pat dry with a clean towel  Put on clean clothes/pajamas   If you choose to wear lotion, please use ONLY the CHG-compatible lotions that are listed below.  Additional instructions for the day of surgery:  If you choose, you may shower the morning of surgery with an antibacterial soap.  DO NOT APPLY any lotions, deodorants, cologne, or perfumes.   Do not bring valuables to the hospital. [facility] is not responsible for any belongings/valuables. Do not wear nail polish, gel polish, artificial nails, or any other type of covering on natural nails (fingers and toes) Do not wear jewelry or makeup Put on clean/comfortable clothes.  Please brush your teeth.  Ask your nurse before applying any prescription medications to the skin.     CHG Compatible Lotions   Aveeno Moisturizing lotion  Cetaphil Moisturizing Cream  Cetaphil Moisturizing Lotion  Clairol Herbal Essence Moisturizing Lotion, Dry Skin  Clairol Herbal Essence Moisturizing Lotion, Extra Dry Skin  Clairol Herbal Essence Moisturizing Lotion, Normal Skin  Curel Age Defying Therapeutic Moisturizing Lotion with Alpha Hydroxy  Curel Extreme Care Body Lotion  Curel Soothing Hands Moisturizing Hand Lotion  Curel Therapeutic Moisturizing Cream, Fragrance-Free  Curel Therapeutic Moisturizing Lotion, Fragrance-Free  Curel Therapeutic Moisturizing Lotion, Original Formula   Eucerin Daily Replenishing Lotion  Eucerin Dry Skin Therapy Plus Alpha Hydroxy Crme  Eucerin Dry Skin Therapy Plus Alpha Hydroxy Lotion  Eucerin Original Crme  Eucerin Original Lotion  Eucerin Plus Crme Eucerin Plus Lotion  Eucerin TriLipid Replenishing Lotion  Keri Anti-Bacterial Hand Lotion  Keri Deep Conditioning Original Lotion Dry Skin Formula Softly Scented  Keri Deep Conditioning Original Lotion, Fragrance Free Sensitive Skin Formula  Keri Lotion Fast Absorbing Fragrance Free Sensitive Skin Formula  Keri Lotion Fast Absorbing Softly Scented Dry Skin Formula  Keri Original Lotion  Keri Skin Renewal Lotion Keri Silky Smooth Lotion  Keri Silky Smooth Sensitive Skin Lotion  Nivea Body Creamy Conditioning Oil  Nivea Body Extra Enriched Teacher, Adult Education Moisturizing Lotion Nivea Crme  Nivea Skin Firming Lotion  NutraDerm 30 Skin Lotion  NutraDerm Skin Lotion  NutraDerm Therapeutic Skin Cream  NutraDerm Therapeutic Skin Lotion  ProShield Protective Hand  Cream  Provon moisturizing lotion  Please read over the following fact sheets that you were given.

## 2024-07-07 ENCOUNTER — Other Ambulatory Visit: Payer: Self-pay

## 2024-07-07 ENCOUNTER — Encounter (HOSPITAL_COMMUNITY)
Admission: RE | Admit: 2024-07-07 | Discharge: 2024-07-07 | Disposition: A | Discharge status: Home / Self Care | Source: Ambulatory Visit | Attending: Orthopaedic Surgery | Admitting: Orthopaedic Surgery

## 2024-07-07 ENCOUNTER — Encounter (HOSPITAL_COMMUNITY): Payer: Self-pay

## 2024-07-07 VITALS — BP 124/86 | HR 102 | Temp 97.8°F | Resp 17 | Ht 67.0 in | Wt 171.2 lb

## 2024-07-07 DIAGNOSIS — Z01812 Encounter for preprocedural laboratory examination: Secondary | ICD-10-CM | POA: Insufficient documentation

## 2024-07-07 DIAGNOSIS — Z01818 Encounter for other preprocedural examination: Secondary | ICD-10-CM

## 2024-07-07 DIAGNOSIS — E785 Hyperlipidemia, unspecified: Secondary | ICD-10-CM | POA: Insufficient documentation

## 2024-07-07 HISTORY — DX: Nausea with vomiting, unspecified: R11.2

## 2024-07-07 HISTORY — DX: Other complications of anesthesia, initial encounter: T88.59XA

## 2024-07-07 HISTORY — DX: Unspecified osteoarthritis, unspecified site: M19.90

## 2024-07-07 LAB — CBC
HCT: 40.7 % (ref 36.0–46.0)
Hemoglobin: 14.5 g/dL (ref 12.0–15.0)
MCH: 32.7 pg (ref 26.0–34.0)
MCHC: 35.6 g/dL (ref 30.0–36.0)
MCV: 91.9 fL (ref 80.0–100.0)
Platelets: 330 K/uL (ref 150–400)
RBC: 4.43 MIL/uL (ref 3.87–5.11)
RDW: 11.5 % (ref 11.5–15.5)
WBC: 6.1 K/uL (ref 4.0–10.5)
nRBC: 0 % (ref 0.0–0.2)

## 2024-07-07 LAB — SURGICAL PCR SCREEN
MRSA, PCR: NEGATIVE
Staphylococcus aureus: NEGATIVE

## 2024-07-07 NOTE — Progress Notes (Signed)
 On January 15th, voicemail left with Maeola Sink, OR scheduler with Dr. Vernetta to have surgical orders entered

## 2024-07-07 NOTE — Progress Notes (Signed)
 PCP - Dr. Worth Kitty Cardiologist - Denies  PPM/ICD - Denies Device Orders - n/a Rep Notified - n/a  Chest x-ray - n/a EKG - 01/25/2024 Stress Test - Denies ECHO - Denies Cardiac Cath - Denies  Sleep Study - Denies CPAP - n/a  No DM  Last dose of GLP1 agonist- n/a GLP1 instructions: n/a  Blood Thinner Instructions: n/a Aspirin  Instructions: n/a  ERAS Protcol - Clear liquids until 0700 morning of surgery PRE-SURGERY Ensure or G2- n/a  COVID TEST- n/a   Anesthesia review: No   Patient denies shortness of breath, fever, cough and chest pain at PAT appointment. Pt denies any respiratory illness/infection in the last two months.   All instructions explained to the patient, with a verbal understanding of the material. Patient agrees to go over the instructions while at home for a better understanding. Patient also instructed to self quarantine after being tested for COVID-19. The opportunity to ask questions was provided.

## 2024-07-13 ENCOUNTER — Encounter: Payer: Self-pay | Admitting: Orthopaedic Surgery

## 2024-07-17 ENCOUNTER — Encounter: Payer: Self-pay | Admitting: Orthopaedic Surgery

## 2024-07-17 NOTE — H&P (Signed)
 TOTAL KNEE ADMISSION H&P  Patient is being admitted for left total knee arthroplasty.  Subjective:  Chief Complaint:left knee pain.  HPI: Theresa Rivera, 64 y.o. female, has a history of pain and functional disability in the left knee due to arthritis and has failed non-surgical conservative treatments for greater than 12 weeks to includeNSAID's and/or analgesics, corticosteriod injections, flexibility and strengthening excercises, supervised PT with diminished ADL's post treatment, and activity modification.  Onset of symptoms was abrupt, starting last year with rapidlly worsening course since that time. The patient noted prior procedures on the knee to include  arthroscopy and menisectomy on the left knee(s).  Patient currently rates pain in the left knee(s) at 10 out of 10 with activity. Patient has night pain, worsening of pain with activity and weight bearing, pain that interferes with activities of daily living, pain with passive range of motion, and joint swelling.  Patient has evidence of periarticular osteophytes, joint space narrowing, and significant subchondral edema on MRI by imaging studies. Th. There is no active infection.  Patient Active Problem List   Diagnosis Date Noted   Unilateral primary osteoarthritis, left knee 05/31/2024   Age-related osteoporosis without current pathological fracture 04/03/2024   Acute medial meniscus tear of right knee 01/31/2024   Urticaria 05/20/2022   Hyperglycemia 04/20/2022   Cold sore 04/18/2021   Dyslipidemia 04/18/2021   Other allergic rhinitis 04/12/2013   Past Medical History:  Diagnosis Date   Arthritis    Right Knee   Complication of anesthesia    PONV (postoperative nausea and vomiting)     Past Surgical History:  Procedure Laterality Date   CATARACT EXTRACTION W/ INTRAOCULAR LENS IMPLANT Bilateral    COLONOSCOPY     KNEE ARTHROSCOPY WITH MENISCAL REPAIR Right 01/31/2024   Procedure: ARTHROSCOPY, KNEE, WITH MENISCUS  REPAIR;  Surgeon: Genelle Standing, MD;  Location: [facility];  Service: Orthopedics;  Laterality: Right;   RHINOPLASTY  1987    No current facility-administered medications for this encounter.   Current Outpatient Medications  Medication Sig Dispense Refill Last Dose/Taking   acetaminophen  (TYLENOL ) 500 MG tablet Take 1,000 mg by mouth daily.   Taking   calcium carbonate (OS-CAL) 1250 (500 Ca) MG chewable tablet Chew 1 tablet by mouth daily.   Taking   Cholecalciferol (VITAMIN D ) 50 MCG (2000 UT) tablet Take 2,000 Units by mouth daily.   Taking   meloxicam  (MOBIC ) 15 MG tablet Take 1 tablet (15 mg total) by mouth daily. (Patient not taking: Reported on 07/03/2024) 30 tablet 0 Not Taking   Menthol , Topical Analgesic, (ICY HOT EX) Apply 1 Application topically daily as needed (pain).   Taking As Needed   Multiple Vitamins-Minerals (CENTRUM SILVER WOMEN 50+) TABS Take 1 tablet by mouth daily.   Taking   naproxen sodium (ALEVE) 220 MG tablet Take 440 mg by mouth every evening.   Taking   Turmeric 500 MG TABS Take 500 mg by mouth daily.   Taking   Vitamin D , Ergocalciferol , (DRISDOL ) 1.25 MG (50000 UNIT) CAPS capsule Take 1 capsule (50,000 Units total) by mouth every 7 (seven) days. (Patient not taking: Reported on 07/03/2024) 4 capsule 0 Not Taking   acyclovir  (ZOVIRAX ) 200 MG capsule Take 4 capsules (800 mg total) by mouth 2 (two) times daily. Take for 5 days as needed for flares (Patient not taking: Reported on 07/03/2024) 90 capsule 1 Not Taking   aspirin  EC 325 MG tablet Take 1 tablet (325 mg total) by mouth daily. (Patient  not taking: Reported on 07/03/2024) 14 tablet 0 Not Taking   Allergies[1]  Social History   Tobacco Use   Smoking status: Never   Smokeless tobacco: Never  Substance Use Topics   Alcohol use: Not Currently    Comment: socially    Family History  Problem Relation Age of Onset   Prostate cancer Father    Breast cancer Maternal Grandmother 53   Eczema Son       Review of Systems  Objective:  Physical Exam Vitals reviewed.  Constitutional:      Appearance: Normal appearance. She is normal weight.  HENT:     Head: Normocephalic and atraumatic.  Eyes:     Extraocular Movements: Extraocular movements intact.     Pupils: Pupils are equal, round, and reactive to light.  Cardiovascular:     Rate and Rhythm: Normal rate and regular rhythm.  Pulmonary:     Effort: Pulmonary effort is normal.     Breath sounds: Normal breath sounds.  Abdominal:     Palpations: Abdomen is soft.  Musculoskeletal:     Cervical back: Normal range of motion and neck supple.     Left knee: Effusion, bony tenderness and crepitus present. Decreased range of motion. Tenderness present over the medial joint line.  Neurological:     Mental Status: She is alert and oriented to person, place, and time.  Psychiatric:        Behavior: Behavior normal.     Vital signs in last 24 hours:    Labs:   Estimated body mass index is 26.81 kg/m as calculated from the following:   Height as of 07/07/24: 5' 7 (1.702 m).   Weight as of 07/07/24: 77.7 kg.   Imaging Review MRI demonstrates severe degenerative joint disease of the left knee(s). The overall alignment ismild varus. The bone quality appears to be excellent for age and reported activity level.      Assessment/Plan:  End stage arthritis, left knee   The patient history, physical examination, clinical judgment of the provider and imaging studies are consistent with end stage degenerative joint disease of the left knee(s) and total knee arthroplasty is deemed medically necessary. The treatment options including medical management, injection therapy arthroscopy and arthroplasty were discussed at length. The risks and benefits of total knee arthroplasty were presented and reviewed. The risks due to aseptic loosening, infection, stiffness, patella tracking problems, thromboembolic complications and other  imponderables were discussed. The patient acknowledged the explanation, agreed to proceed with the plan and consent was signed. Patient is being admitted for inpatient treatment for surgery, pain control, PT, OT, prophylactic antibiotics, VTE prophylaxis, progressive ambulation and ADL's and discharge planning. The patient is planning to be discharged home with home health services        [1] No Known Allergies

## 2024-07-18 ENCOUNTER — Encounter (HOSPITAL_COMMUNITY): Admission: RE | Disposition: A | Payer: Self-pay | Source: Home / Self Care | Attending: Orthopaedic Surgery

## 2024-07-18 ENCOUNTER — Ambulatory Visit (HOSPITAL_COMMUNITY)

## 2024-07-18 ENCOUNTER — Observation Stay (HOSPITAL_COMMUNITY)

## 2024-07-18 ENCOUNTER — Encounter (HOSPITAL_COMMUNITY): Payer: Self-pay | Admitting: Orthopaedic Surgery

## 2024-07-18 ENCOUNTER — Other Ambulatory Visit: Payer: Self-pay | Admitting: Physician Assistant

## 2024-07-18 ENCOUNTER — Observation Stay (HOSPITAL_COMMUNITY)
Admission: RE | Admit: 2024-07-18 | Discharge: 2024-07-19 | Disposition: A | Attending: Orthopaedic Surgery | Admitting: Orthopaedic Surgery

## 2024-07-18 DIAGNOSIS — M1712 Unilateral primary osteoarthritis, left knee: Principal | ICD-10-CM | POA: Diagnosis present

## 2024-07-18 DIAGNOSIS — Z01818 Encounter for other preprocedural examination: Secondary | ICD-10-CM

## 2024-07-18 DIAGNOSIS — M25561 Pain in right knee: Secondary | ICD-10-CM | POA: Diagnosis present

## 2024-07-18 DIAGNOSIS — M1711 Unilateral primary osteoarthritis, right knee: Principal | ICD-10-CM | POA: Insufficient documentation

## 2024-07-18 DIAGNOSIS — Z96651 Presence of right artificial knee joint: Secondary | ICD-10-CM

## 2024-07-18 MED ORDER — METHOCARBAMOL 500 MG PO TABS: 500.0000 mg | ORAL_TABLET | Freq: Four times a day (QID) | ORAL | Status: DC | PRN

## 2024-07-18 MED ORDER — MENTHOL 3 MG MT LOZG: 1.0000 | LOZENGE | OROMUCOSAL | Status: DC | PRN

## 2024-07-18 MED ORDER — PROPOFOL 10 MG/ML IV BOLUS
INTRAVENOUS | Status: DC | PRN
  Administered 2024-07-18: 30 mg via INTRAVENOUS

## 2024-07-18 MED ORDER — GLYCOPYRROLATE 0.2 MG/ML IJ SOLN
INTRAMUSCULAR | Status: DC | PRN
  Administered 2024-07-18: .2 mg via INTRAVENOUS

## 2024-07-18 MED ORDER — CEFAZOLIN SODIUM-DEXTROSE 2-4 GM/100ML-% IV SOLN
2.0000 g | Freq: Four times a day (QID) | INTRAVENOUS | Status: AC
  Administered 2024-07-18 – 2024-07-19 (×2): 2 g via INTRAVENOUS
  Filled 2024-07-18 (×2): qty 100

## 2024-07-18 MED ORDER — FENTANYL CITRATE (PF) 100 MCG/2ML IJ SOLN
INTRAMUSCULAR | Status: DC | PRN
  Administered 2024-07-18 (×2): 50 ug via INTRAVENOUS

## 2024-07-18 MED ORDER — ONDANSETRON HCL 4 MG PO TABS
4.0000 mg | ORAL_TABLET | Freq: Four times a day (QID) | ORAL | Status: DC | PRN
  Administered 2024-07-19: 4 mg via ORAL
  Filled 2024-07-18: qty 1

## 2024-07-18 MED ORDER — ORAL CARE MOUTH RINSE: 15.0000 mL | Freq: Once | OROMUCOSAL | Status: DC

## 2024-07-18 MED ORDER — PANTOPRAZOLE SODIUM 40 MG PO TBEC
40.0000 mg | DELAYED_RELEASE_TABLET | Freq: Every day | ORAL | Status: DC
  Administered 2024-07-18 – 2024-07-19 (×2): 40 mg via ORAL
  Filled 2024-07-18 (×2): qty 1

## 2024-07-18 MED ORDER — ONDANSETRON HCL 4 MG/2ML IJ SOLN: 4.0000 mg | Freq: Four times a day (QID) | INTRAMUSCULAR | Status: DC | PRN

## 2024-07-18 MED ORDER — FENTANYL CITRATE (PF) 250 MCG/5ML IJ SOLN: INTRAMUSCULAR | Status: DC | PRN

## 2024-07-18 MED ORDER — PHENOL 1.4 % MT LIQD: 1.0000 | OROMUCOSAL | Status: DC | PRN

## 2024-07-18 MED ORDER — POLYETHYLENE GLYCOL 3350 17 G PO PACK: 17.0000 g | PACK | Freq: Every day | ORAL | Status: DC | PRN

## 2024-07-18 MED ORDER — MIDAZOLAM HCL 2 MG/2ML IJ SOLN
INTRAMUSCULAR | Status: AC
  Administered 2024-07-18: 1 mg via INTRAVENOUS
  Filled 2024-07-18: qty 2

## 2024-07-18 MED ORDER — PROPOFOL 10 MG/ML IV BOLUS
INTRAVENOUS | Status: AC
  Filled 2024-07-18: qty 20

## 2024-07-18 MED ORDER — ALUM & MAG HYDROXIDE-SIMETH 200-200-20 MG/5ML PO SUSP: 30.0000 mL | ORAL | Status: DC | PRN

## 2024-07-18 MED ORDER — BUPIVACAINE-EPINEPHRINE (PF) 0.5% -1:200000 IJ SOLN
INTRAMUSCULAR | Status: DC | PRN
  Administered 2024-07-18: 20 mL

## 2024-07-18 MED ORDER — CHLORHEXIDINE GLUCONATE 0.12 % MT SOLN
15.0000 mL | Freq: Once | OROMUCOSAL | Status: DC
  Filled 2024-07-18: qty 15

## 2024-07-18 MED ORDER — ACETAMINOPHEN 10 MG/ML IV SOLN: 1000.0000 mg | Freq: Once | INTRAVENOUS | Status: DC | PRN

## 2024-07-18 MED ORDER — ONDANSETRON HCL 4 MG/2ML IJ SOLN
INTRAMUSCULAR | Status: DC | PRN
  Administered 2024-07-18: 4 mg via INTRAVENOUS

## 2024-07-18 MED ORDER — DEXAMETHASONE SOD PHOSPHATE PF 10 MG/ML IJ SOLN
INTRAMUSCULAR | Status: DC | PRN
  Administered 2024-07-18: 10 mg via INTRAVENOUS

## 2024-07-18 MED ORDER — DIPHENHYDRAMINE HCL 12.5 MG/5ML PO ELIX: 12.5000 mg | ORAL_SOLUTION | ORAL | Status: DC | PRN

## 2024-07-18 MED ORDER — DEXAMETHASONE SOD PHOSPHATE PF 10 MG/ML IJ SOLN
INTRAMUSCULAR | Status: AC
  Filled 2024-07-18: qty 1

## 2024-07-18 MED ORDER — ACETAMINOPHEN 325 MG PO TABS: 325.0000 mg | ORAL_TABLET | Freq: Four times a day (QID) | ORAL | Status: DC | PRN

## 2024-07-18 MED ORDER — LIDOCAINE 2% (20 MG/ML) 5 ML SYRINGE
INTRAMUSCULAR | Status: AC
  Filled 2024-07-18: qty 5

## 2024-07-18 MED ORDER — PHENYLEPHRINE HCL-NACL 20-0.9 MG/250ML-% IV SOLN
INTRAVENOUS | Status: DC | PRN
  Administered 2024-07-18: 25 ug/min via INTRAVENOUS

## 2024-07-18 MED ORDER — LIDOCAINE 2% (20 MG/ML) 5 ML SYRINGE
INTRAMUSCULAR | Status: DC | PRN
  Administered 2024-07-18: 60 mg via INTRAVENOUS

## 2024-07-18 MED ORDER — FENTANYL CITRATE (PF) 100 MCG/2ML IJ SOLN
INTRAMUSCULAR | Status: AC
  Administered 2024-07-18: 50 ug via INTRAVENOUS
  Filled 2024-07-18: qty 2

## 2024-07-18 MED ORDER — BUPIVACAINE-EPINEPHRINE (PF) 0.25% -1:200000 IJ SOLN
INTRAMUSCULAR | Status: DC | PRN
  Administered 2024-07-18: 30 mL

## 2024-07-18 MED ORDER — POVIDONE-IODINE 10 % EX SWAB: 2.0000 | Freq: Once | CUTANEOUS | Status: DC

## 2024-07-18 MED ORDER — OXYCODONE HCL 5 MG PO TABS
5.0000 mg | ORAL_TABLET | Freq: Once | ORAL | Status: AC | PRN
  Administered 2024-07-18: 5 mg via ORAL

## 2024-07-18 MED ORDER — HYDROMORPHONE HCL 1 MG/ML IJ SOLN: 0.5000 mg | INTRAMUSCULAR | Status: DC | PRN

## 2024-07-18 MED ORDER — LACTATED RINGERS IV SOLN: INTRAVENOUS | Status: DC

## 2024-07-18 MED ORDER — ONDANSETRON HCL 4 MG/2ML IJ SOLN
INTRAMUSCULAR | Status: AC
  Filled 2024-07-18: qty 2

## 2024-07-18 MED ORDER — PHENYLEPHRINE 80 MCG/ML (10ML) SYRINGE FOR IV PUSH (FOR BLOOD PRESSURE SUPPORT)
PREFILLED_SYRINGE | INTRAVENOUS | Status: DC | PRN
  Administered 2024-07-18: 160 ug via INTRAVENOUS

## 2024-07-18 MED ORDER — OXYCODONE HCL 5 MG PO TABS
10.0000 mg | ORAL_TABLET | ORAL | Status: DC | PRN
  Administered 2024-07-18: 10 mg via ORAL
  Administered 2024-07-19 (×2): 15 mg via ORAL
  Filled 2024-07-18: qty 2
  Filled 2024-07-18 (×2): qty 3

## 2024-07-18 MED ORDER — SODIUM CHLORIDE 0.9 % IV SOLN: INTRAVENOUS | Status: DC

## 2024-07-18 MED ORDER — DOCUSATE SODIUM 100 MG PO CAPS
100.0000 mg | ORAL_CAPSULE | Freq: Two times a day (BID) | ORAL | Status: DC
  Administered 2024-07-18 – 2024-07-19 (×2): 100 mg via ORAL
  Filled 2024-07-18 (×2): qty 1

## 2024-07-18 MED ORDER — MIDAZOLAM HCL (PF) 2 MG/2ML IJ SOLN: 1.0000 mg | Freq: Once | INTRAMUSCULAR | Status: AC

## 2024-07-18 MED ORDER — METOCLOPRAMIDE HCL 5 MG PO TABS: 5.0000 mg | ORAL_TABLET | Freq: Three times a day (TID) | ORAL | Status: DC | PRN

## 2024-07-18 MED ORDER — ONDANSETRON HCL 4 MG/2ML IJ SOLN: 4.0000 mg | Freq: Once | INTRAMUSCULAR | Status: DC | PRN

## 2024-07-18 MED ORDER — CEFAZOLIN SODIUM-DEXTROSE 2-4 GM/100ML-% IV SOLN
2.0000 g | INTRAVENOUS | Status: AC
  Administered 2024-07-18: 2 g via INTRAVENOUS
  Filled 2024-07-18: qty 100

## 2024-07-18 MED ORDER — FENTANYL CITRATE (PF) 100 MCG/2ML IJ SOLN: 50.0000 ug | Freq: Once | INTRAMUSCULAR | Status: AC

## 2024-07-18 MED ORDER — ASPIRIN 81 MG PO CHEW
81.0000 mg | CHEWABLE_TABLET | Freq: Two times a day (BID) | ORAL | Status: DC
  Administered 2024-07-18 – 2024-07-19 (×2): 81 mg via ORAL
  Filled 2024-07-18 (×2): qty 1

## 2024-07-18 MED ORDER — OXYCODONE HCL 5 MG PO TABS
ORAL_TABLET | ORAL | Status: AC
  Filled 2024-07-18: qty 1

## 2024-07-18 MED ORDER — METHOCARBAMOL 1000 MG/10ML IJ SOLN: 500.0000 mg | Freq: Four times a day (QID) | INTRAMUSCULAR | Status: DC | PRN

## 2024-07-18 MED ORDER — METOCLOPRAMIDE HCL 5 MG/ML IJ SOLN: 5.0000 mg | Freq: Three times a day (TID) | INTRAMUSCULAR | Status: DC | PRN

## 2024-07-18 MED ORDER — TRANEXAMIC ACID-NACL 1000-0.7 MG/100ML-% IV SOLN
1000.0000 mg | INTRAVENOUS | Status: AC
  Administered 2024-07-18: 1000 mg via INTRAVENOUS
  Filled 2024-07-18: qty 100

## 2024-07-18 MED ORDER — OXYCODONE HCL 5 MG PO TABS
5.0000 mg | ORAL_TABLET | ORAL | Status: DC | PRN
  Administered 2024-07-19: 10 mg via ORAL
  Filled 2024-07-18: qty 1
  Filled 2024-07-18: qty 2

## 2024-07-18 MED ORDER — OXYCODONE HCL 5 MG/5ML PO SOLN: 5.0000 mg | Freq: Once | ORAL | Status: AC | PRN

## 2024-07-18 MED ORDER — FENTANYL CITRATE (PF) 100 MCG/2ML IJ SOLN: 25.0000 ug | INTRAMUSCULAR | Status: DC | PRN

## 2024-07-18 MED ORDER — PROPOFOL 500 MG/50ML IV EMUL
INTRAVENOUS | Status: DC | PRN
  Administered 2024-07-18: 125 ug/kg/min via INTRAVENOUS

## 2024-07-18 MED ORDER — FENTANYL CITRATE (PF) 100 MCG/2ML IJ SOLN
INTRAMUSCULAR | Status: AC
  Filled 2024-07-18: qty 2

## 2024-07-18 NOTE — Anesthesia Preprocedure Evaluation (Addendum)
"                                    Anesthesia Evaluation  Patient identified by MRN, date of birth, ID band Patient awake    Reviewed: Allergy  & Precautions, NPO status , Patient's Chart, lab work & pertinent test results, reviewed documented beta blocker date and time   History of Anesthesia Complications (+) PONV and history of anesthetic complications  Airway Mallampati: II  TM Distance: >3 FB     Dental no notable dental hx.    Pulmonary neg COPD   breath sounds clear to auscultation       Cardiovascular (-) CAD, (-) Past MI and (-) Cardiac Stents  Rhythm:Regular Rate:Normal     Neuro/Psych neg Seizures    GI/Hepatic ,neg GERD  ,,(+) neg Cirrhosis        Endo/Other    Renal/GU Renal disease     Musculoskeletal  (+) Arthritis , Osteoarthritis,    Abdominal   Peds  Hematology   Anesthesia Other Findings   Reproductive/Obstetrics                              Anesthesia Physical Anesthesia Plan  ASA: 2  Anesthesia Plan: Spinal and MAC   Post-op Pain Management: Regional block*   Induction: Intravenous  PONV Risk Score and Plan: 3 and Ondansetron  and Propofol  infusion  Airway Management Planned: Natural Airway and Simple Face Mask  Additional Equipment:   Intra-op Plan:   Post-operative Plan:   Informed Consent: I have reviewed the patients History and Physical, chart, labs and discussed the procedure including the risks, benefits and alternatives for the proposed anesthesia with the patient or authorized representative who has indicated his/her understanding and acceptance.     Dental advisory given  Plan Discussed with: CRNA  Anesthesia Plan Comments:         Anesthesia Quick Evaluation  "

## 2024-07-18 NOTE — Discharge Instructions (Signed)

## 2024-07-18 NOTE — Op Note (Signed)
 "    Operative Note  Date of operation: 07/18/2024 Preoperative diagnosis: Right knee primary osteoarthritis Postoperative diagnosis: Same  Procedure: Right cemented total knee arthroplasty  Implants: Biomet/Zimmer persona cemented knee system (medial congruent CR) Implant Name Type Inv. Item Serial No. Manufacturer Lot No. LRB No. Used Action  CEMENT BONE R 1X40 - [id number] Cement CEMENT BONE R 1X40  ZIMMER RECON(ORTH,TRAU,BIO,SG) [id number] Right 1 Implanted  CEMENT BONE R 1X40 - [id number] Cement CEMENT BONE R 1X40  ZIMMER RECON(ORTH,TRAU,BIO,SG) [id number] Right 1 Implanted  INSERT TIB ASF CD/8-9 12 RT - [id number] Insert INSERT TIB ASF CD/8-9 12 RT  ZIMMER RECON(ORTH,TRAU,BIO,SG) [id number] Right 1 Implanted  STEM POLY PAT PLY 11M KNEE - [id number] Knees STEM POLY PAT PLY 11M KNEE  ZIMMER RECON(ORTH,TRAU,BIO,SG) [id number] Right 1 Implanted  COMPONENT TIB CMT PS D 0D RT - [id number] Joint COMPONENT TIB CMT PS D 0D RT  ZIMMER RECON(ORTH,TRAU,BIO,SG) [id number] Right 1 Implanted  COMPONENT FEM CMT CR PRS [id number] - [id number] Joint COMPONENT FEM CMT CR PRS [id number]  ZIMMER RECON(ORTH,TRAU,BIO,SG) [id number] Right 1 Implanted   Surgeon: Lonni GRADE. Vernetta, MD Assistant: Tory Gaskins, PA-C  Anesthesia: #1 left lower extremity regional block, #2 spinal, 3 local Tourniquet time: Less than 1 hour EBL: 50 cc Antibiotics: IV Ancef  Complications: None  Indications: The patient is a very pleasant and active 64 year old female who developed significant arthritic changes in the medial femoral condyle and medial tibial plateau of her right knee following a right knee arthroscopy with a medial meniscal repair.  Her symptoms worsened over the last few months and she has got to the point where she has been having significant pain.  A follow-up MRI of her right knee showed significant subchondral edema in the medial femoral condyle and medial tibial plateau as well as significant cartilage wear and patellofemoral  arthritic changes.  At this point we recommended a total knee arthroplasty due to the significance of her pain and the detrimental effect that is having on her mobility, her quality of life and actives daily living.  We did discuss in length in detail the risks of acute blood loss anemia, nerve and vessel injury, fracture, infection, DVT, implant failure and wound healing issues.  She understands that our goals are hopefully decreased pain, improved mobility and improve quality of life with time.  Procedure description: After informed consent was obtained the appropriate right knee was marked, anesthesia obtained a right lower extremity adductor canal block in the holding room.  The patient was then brought to the operating room and set up on the operating table where spinal anesthesia was obtained.  She was then laid in the supine position on the operating table and a Foley catheter was placed.  A nonsterile tractors placed around her upper right thigh and her right thigh, knee, leg, ankle and foot were prepped and draped with DuraPrep and sterile drapes including a sterile stockinette.  A timeout was called and she was identified as the correct patient the correct right knee.  An Esmarch was then used to wrap out the leg and the tourniquet is inflated to 300 mm of pressure.  With the knee extended a direct midline incision was made over patella and carried proximally distally.  Dissection was carried down to the knee joint and a medial parapatellar arthrotomy was made.  A small joint effusion was encountered.  With the knee in a flexed position there was significant cartilage wear in the medial compartment the knee and the patellofemoral joint.  There  was also a suture anchor likely seen in the medial tibial plateau at the weightbearing surface.  Osteophytes were removed from the knee as well as the medial and lateral meniscus and the ACL.  We then used an extramedullary based cutting guide for making her  proximal tibia cut correction for varus and valgus and a 7 degree slope.  We made this cut to take 2 mm off the low side.  We made the cut without difficulty.  We then went to the femur and used an intramedullary based cutting guide through the notch of the femur for our distal femur cut setting this for a right knee at 5 degrees externally rotated and a 10 mm distal femoral cut.  We then brought the knee back down to full extension and achieve full extension with a 10 mm extension block.  We then impacted the femur and put a femoral sizing guide based off the epicondylar axis and Whitesides line.  Based off this we chose a size 8 femur.  We put a 4-in-1 cutting block for a size 8 femur and made our anterior and posterior cuts followed our chamfer cuts.  We then backed the tibial plateau and chose a size D right tibial tray for coverage over the tibial plateau so the rotation of the tibial tubercle and the femur.  We did our drill hole and keel punch off of this.  We then trialed our size D right tibia combined with her size a right CR standard femur.  We placed a 10 mm right medial congruent path and insert and we went up to a 12 mm thickness insert because we felt there was better stability and still full range of motion without 12 mm insert.  We then made a patella cut and drilled 3 holes for a size 29 patella button.  We again we are pleased with all trial and rotation of the knee except for knowing that we needed a more narrow femur for the real femur.  We then removed all trial instrumentation from the knee and irrigated the knee with normal saline solution using pulsatile lavage.  We then placed Marcaine  with epinephrine  around the arthrotomy.  Next we mixed our cement and with the knee in a flexed position we cemented our Biomet/Zimmer persona tibial tray for a right knee size D followed by cementing our size 8 CR narrow femur for right knee.  We placed our 12 mm thickness right medial congruent polyethylene  insert and cemented our size 29 patella button.  We then held the knee fully extended and compressed while the cemented hardened.  Once the cemented hardened the tourniquet was let down and hemostasis was obtained with electrocautery.  We then closed the arthrotomy with interrupted #1 Vicryl suture followed by 0 Vicryl to close deep tissue and 2-0 Vicryl to close subcutaneous tissue.  The skin was closed with staples.  Well-padded sterile dressing was applied.  The patient was taken to the recovery room.  Tory Gaskins, PA-C did assist during the entire case and beginning to end and his assistance was crucial and medically necessary for soft tissue management and retraction, helping guide implant placement and a layered closure of the wound. "

## 2024-07-18 NOTE — Anesthesia Procedure Notes (Signed)
 Anesthesia Regional Block: Adductor canal block   Pre-Anesthetic Checklist: , timeout performed,  Correct Patient, Correct Site, Correct Laterality,  Correct Procedure, Correct Position, site marked,  Risks and benefits discussed,  Surgical consent,  Pre-op evaluation,  At surgeon's request and post-op pain management  Laterality: Right  Prep: Maximum Sterile Barrier Precautions used, chloraprep       Needles:  Injection technique: Single-shot  Needle Type: Echogenic Needle      Needle Gauge: 20     Additional Needles:   Procedures:,,,, ultrasound used (permanent image in chart),,    Narrative:  Start time: 07/18/2024 1:15 PM End time: 07/18/2024 1:20 PM Injection made incrementally with aspirations every 5 mL.  Performed by: Personally  Anesthesiologist: Keneth Lynwood POUR, MD

## 2024-07-18 NOTE — Telephone Encounter (Signed)
 I called patient and she is headed to the hospital now for surgery.

## 2024-07-18 NOTE — Transfer of Care (Signed)
 Immediate Anesthesia Transfer of Care Note  Patient: Theresa Rivera  Procedure(s) Performed: ARTHROPLASTY, KNEE, TOTAL (Right: Knee)  Patient Location: PACU  Anesthesia Type:MAC, Regional, and Spinal  Level of Consciousness: awake, alert , and oriented  Airway & Oxygen Therapy: Patient Spontanous Breathing  Post-op Assessment: Report given to RN and Post -op Vital signs reviewed and stable  Post vital signs: Reviewed and stable  Last Vitals:  Vitals Value Taken Time  BP 124/67 07/18/24 15:46  Temp 36.7 C 07/18/24 15:45  Pulse 73 07/18/24 15:58  Resp 17 07/18/24 15:58  SpO2 97 % 07/18/24 15:58  Vitals shown include unfiled device data.  Last Pain:  Vitals:   07/18/24 1232  TempSrc:   PainSc: 0-No pain         Complications: No notable events documented.

## 2024-07-18 NOTE — Anesthesia Procedure Notes (Signed)
 Spinal  Patient location during procedure: OR Start time: 07/18/2024 2:00 PM End time: 07/18/2024 2:05 PM Reason for block: surgical anesthesia  Staffing Performed: anesthesiologist  Authorized by: Keneth Lynwood POUR, MD   Performed by: Keneth Lynwood POUR, MD  Preanesthetic Checklist Completed: patient identified, IV checked, site marked, risks and benefits discussed, surgical consent, monitors and equipment checked, pre-op evaluation and timeout performed Spinal Block Patient position: sitting Prep: DuraPrep Patient monitoring: heart rate, cardiac monitor, continuous pulse ox and blood pressure Approach: midline Location: L3-4 Injection technique: single-shot Needle Needle type: Sprotte  Needle gauge: 24 G Needle length: 9 cm Assessment Sensory level: T4 Events: CSF return

## 2024-07-18 NOTE — Interval H&P Note (Signed)
 History and Physical Interval Note: The patient understands that she is here today for a right total knee replacement to treat her significant right knee pain and arthritis.  There has been no acute or interval change in her medical status.  The risks and benefits of surgery have been discussed in detail and informed consent has been obtained.  The right operative knee has been marked.  07/18/2024 12:08 PM  Theresa Rivera Alert  has presented today for surgery, with the diagnosis of osteoarthritis right knee.  The various methods of treatment have been discussed with the patient and family. After consideration of risks, benefits and other options for treatment, the patient has consented to  Procedures: ARTHROPLASTY, KNEE, TOTAL (Right) as a surgical intervention.  The patient's history has been reviewed, patient examined, no change in status, stable for surgery.  I have reviewed the patient's chart and labs.  Questions were answered to the patient's satisfaction.     Lonni CINDERELLA Poli

## 2024-07-19 ENCOUNTER — Other Ambulatory Visit (HOSPITAL_COMMUNITY): Payer: Self-pay

## 2024-07-19 ENCOUNTER — Encounter (HOSPITAL_COMMUNITY): Payer: Self-pay | Admitting: Orthopaedic Surgery

## 2024-07-19 DIAGNOSIS — M1711 Unilateral primary osteoarthritis, right knee: Secondary | ICD-10-CM | POA: Diagnosis not present

## 2024-07-19 LAB — CBC
HCT: 33.1 % — ABNORMAL LOW (ref 36.0–46.0)
Hemoglobin: 12.2 g/dL (ref 12.0–15.0)
MCH: 33.3 pg (ref 26.0–34.0)
MCHC: 36.9 g/dL — ABNORMAL HIGH (ref 30.0–36.0)
MCV: 90.4 fL (ref 80.0–100.0)
Platelets: 281 10*3/uL (ref 150–400)
RBC: 3.66 MIL/uL — ABNORMAL LOW (ref 3.87–5.11)
RDW: 11.2 % — ABNORMAL LOW (ref 11.5–15.5)
WBC: 15.3 10*3/uL — ABNORMAL HIGH (ref 4.0–10.5)
nRBC: 0 % (ref 0.0–0.2)

## 2024-07-19 LAB — BASIC METABOLIC PANEL WITH GFR
Anion gap: 9 (ref 5–15)
BUN: 13 mg/dL (ref 8–23)
CO2: 27 mmol/L (ref 22–32)
Calcium: 9 mg/dL (ref 8.9–10.3)
Chloride: 100 mmol/L (ref 98–111)
Creatinine, Ser: 0.73 mg/dL (ref 0.44–1.00)
GFR, Estimated: >60 mL/min
Glucose, Bld: 138 mg/dL — ABNORMAL HIGH (ref 70–99)
Potassium: 4.3 mmol/L (ref 3.5–5.1)
Sodium: 136 mmol/L (ref 135–145)

## 2024-07-19 MED ORDER — OXYCODONE HCL 5 MG PO TABS
5.0000 mg | ORAL_TABLET | ORAL | 0 refills | Status: DC | PRN
  Filled 2024-07-19: qty 30, 3d supply, fill #0

## 2024-07-19 MED ORDER — KETOROLAC TROMETHAMINE 15 MG/ML IJ SOLN
7.5000 mg | Freq: Four times a day (QID) | INTRAMUSCULAR | Status: AC
  Administered 2024-07-19 (×2): 7.5 mg via INTRAVENOUS
  Filled 2024-07-19 (×2): qty 1

## 2024-07-19 MED ORDER — ASPIRIN 81 MG PO CHEW
81.0000 mg | CHEWABLE_TABLET | Freq: Two times a day (BID) | ORAL | 0 refills | Status: AC
  Filled 2024-07-19: qty 30, 15d supply, fill #0

## 2024-07-19 MED ORDER — METHOCARBAMOL 500 MG PO TABS
500.0000 mg | ORAL_TABLET | Freq: Four times a day (QID) | ORAL | 1 refills | Status: AC | PRN
  Filled 2024-07-19: qty 30, 8d supply, fill #0
  Filled 2024-07-24: qty 30, 8d supply, fill #1

## 2024-07-19 MED ORDER — ONDANSETRON 4 MG PO TBDP
4.0000 mg | ORAL_TABLET | Freq: Three times a day (TID) | ORAL | 0 refills | Status: AC | PRN
  Filled 2024-07-19: qty 20, 7d supply, fill #0

## 2024-07-19 NOTE — Evaluation (Signed)
 Physical Therapy Evaluation Patient Details Name: Theresa Rivera MRN: [id number] DOB: [birth year] Today's Date: 07/19/2024  History of Present Illness  Pt is 64 year old presented to [facility] on  07/18/24 for rt TKR. PMH - osteoarthritis, osteoporosis  Clinical Impression  Pt doing well with mobility s/p Rt TKR. Pt very motivated and expect will make excellent progress. Plans to return home with husband. Will see for BID treatment in PM.         If plan is discharge home, recommend the following: A little help with bathing/dressing/bathroom;A little help with walking and/or transfers;Assist for transportation;Assistance with cooking/housework;Help with stairs or ramp for entrance   Can travel by private vehicle        Equipment Recommendations None recommended by PT  Recommendations for Other Services       Functional Status Assessment Patient has had a recent decline in their functional status and demonstrates the ability to make significant improvements in function in a reasonable and predictable amount of time.     Precautions / Restrictions Precautions Precautions: Knee Recall of Precautions/Restrictions: Intact Restrictions Weight Bearing Restrictions Per Provider Order: Yes RLE Weight Bearing Per Provider Order: Weight bearing as tolerated      Mobility  Bed Mobility Overal bed mobility: Needs Assistance Bed Mobility: Supine to Sit     Supine to sit: Min assist     General bed mobility comments: Assist to bring RLE off EOB    Transfers Overall transfer level: Needs assistance Equipment used: Rolling walker (2 wheels) Transfers: Sit to/from Stand Sit to Stand: Min assist, Supervision           General transfer comment: Min assist for power up on initial stand. Supervision to stand from commode.    Ambulation/Gait Ambulation/Gait assistance: Contact guard assist Gait Distance (Feet): 200 Feet Assistive device: Rolling walker (2 wheels) Gait  Pattern/deviations: Step-through pattern, Decreased stance time - right Gait velocity: decr Gait velocity interpretation: 1.31 - 2.62 ft/sec, indicative of limited community ambulator   General Gait Details: Assist for safety.  Stairs            Wheelchair Mobility     Tilt Bed    Modified Rankin (Stroke Patients Only)       Balance Overall balance assessment: No apparent balance deficits (not formally assessed)                                           Pertinent Vitals/Pain Pain Assessment Pain Assessment: Faces Faces Pain Scale: Hurts even more Pain Location: rt knee Pain Descriptors / Indicators: Grimacing, Guarding Pain Intervention(s): Limited activity within patient's tolerance, Monitored during session, Premedicated before session    Home Living Family/patient expects to be discharged to:: Private residence Living Arrangements: Spouse/significant other Available Help at Discharge: Family;Available 24 hours/day Type of Home: House Home Access: Stairs to enter Entrance Stairs-Rails: Doctor, General Practice of Steps: 3   Home Layout: Two level;Able to live on main level with bedroom/bathroom Home Equipment: Agricultural Consultant (2 wheels)      Prior Function Prior Level of Function : Independent/Modified Independent                     Extremity/Trunk Assessment   Upper Extremity Assessment Upper Extremity Assessment: Overall WFL for tasks assessed    Lower Extremity Assessment Lower Extremity Assessment: RLE deficits/detail RLE Deficits / Details: Good  quad set. Knee ROM 5-85       Communication   Communication Communication: No apparent difficulties    Cognition Arousal: Alert Behavior During Therapy: WFL for tasks assessed/performed   PT - Cognitive impairments: No apparent impairments                         Following commands: Intact       Cueing Cueing Techniques: Verbal cues      General Comments      Exercises Total Joint Exercises Ankle Circles/Pumps: AROM, Both, 5 reps, Supine Quad Sets: AROM, 5 reps, Right, Supine Heel Slides: AAROM, Right, 5 reps, Supine Hip ABduction/ADduction: AAROM, Right, 5 reps, Supine Straight Leg Raises: AAROM, Right, 5 reps, Supine Long Arc Quad: AAROM, Right, 5 reps, Seated Knee Flexion: AAROM, Right, 5 reps, Seated Goniometric ROM: ~5-85   Assessment/Plan    PT Assessment Patient needs continued PT services  PT Problem List Decreased strength;Decreased range of motion;Decreased mobility;Pain       PT Treatment Interventions DME instruction;Stair training;Therapeutic activities;Therapeutic exercise;Functional mobility training;Gait training;Patient/family education    PT Goals (Current goals can be found in the Care Plan section)  Acute Rehab PT Goals Patient Stated Goal: return to golf PT Goal Formulation: With patient Time For Goal Achievement: 07/21/24 Potential to Achieve Goals: Good    Frequency 7X/week     Co-evaluation               AM-PAC PT 6 Clicks Mobility  Outcome Measure Help needed turning from your back to your side while in a flat bed without using bedrails?: A Little Help needed moving from lying on your back to sitting on the side of a flat bed without using bedrails?: A Little Help needed moving to and from a bed to a chair (including a wheelchair)?: A Little Help needed standing up from a chair using your arms (e.g., wheelchair or bedside chair)?: A Little Help needed to walk in hospital room?: A Little Help needed climbing 3-5 steps with a railing? : A Little 6 Click Score: 18    End of Session Equipment Utilized During Treatment: Gait belt Activity Tolerance: Patient tolerated treatment well Patient left: in chair;with call bell/phone within reach;with family/visitor present Nurse Communication: Mobility status PT Visit Diagnosis: Other abnormalities of gait and mobility  (R26.89);Pain Pain - Right/Left: Right Pain - part of body: Knee    Time: 0900-0933 PT Time Calculation (min) (ACUTE ONLY): 33 min   Charges:   PT Evaluation $PT Eval Low Complexity: 1 Low PT Treatments $Gait Training: 8-22 mins PT General Charges $$ ACUTE PT VISIT: 1 Visit         [facility] PT Acute Rehabilitation Services Office [phone]   Rodgers ORN [facility] 07/19/2024, 9:47 AM

## 2024-07-19 NOTE — Progress Notes (Signed)
 Subjective: 1 Day Post-Op Procedures (LRB): ARTHROPLASTY, KNEE, TOTAL (Right) Patient reports pain as moderate.  She did report having a tough evening with pain control.  She has not been up with therapy either.  Objective: Vital signs in last 24 hours: Temp:  [98 F (36.7 C)-98.3 F (36.8 C)] 98 F (36.7 C) (01/28 0729) Pulse Rate:  [62-86] 71 (01/28 0729) Resp:  [10-20] 16 (01/28 0628) BP: (107-148)/(66-95) 117/68 (01/28 0729) SpO2:  [95 %-100 %] 96 % (01/28 0729) Weight:  [77.7 kg] 77.7 kg (01/27 1210)  Intake/Output from previous day: 01/27 0701 - 01/28 0700 In: 200 [IV Piggyback:200] Out: 1375 [Urine:975; Blood:400] Intake/Output this shift: No intake/output data recorded.  No results for input(s): HGB in the last 72 hours. No results for input(s): WBC, RBC, HCT, PLT in the last 72 hours. No results for input(s): NA, K, CL, CO2, BUN, CREATININE, GLUCOSE, CALCIUM in the last 72 hours. No results for input(s): LABPT, INR in the last 72 hours.  Sensation intact distally Intact pulses distally Dorsiflexion/Plantar flexion intact Compartment soft   Assessment/Plan: 1 Day Post-Op Procedures (LRB): ARTHROPLASTY, KNEE, TOTAL (Right) Up with therapy      Theresa Rivera 07/19/2024, 7:48 AM

## 2024-07-19 NOTE — Discharge Summary (Signed)
 Patient ID: Theresa Rivera MRN: [id number] DOB/AGE: 07/12/1960 64 y.o.  Admit date: 07/18/2024 Discharge date: 07/19/2024  Admission Diagnoses:  Principal Problem:   Unilateral primary osteoarthritis, left knee Active Problems:   Status post total right knee replacement   Discharge Diagnoses:  Same  Past Medical History:  Diagnosis Date   Arthritis    Right Knee   Complication of anesthesia    PONV (postoperative nausea and vomiting)     Surgeries: Procedures: ARTHROPLASTY, KNEE, TOTAL on 07/18/2024   Consultants:   Discharged Condition: Improved  Hospital Course: Theresa Rivera is an 64 y.o. female who was admitted 07/18/2024 for operative treatment ofUnilateral primary osteoarthritis, left knee. Patient has severe unremitting pain that affects sleep, daily activities, and work/hobbies. After pre-op clearance the patient was taken to the operating room on 07/18/2024 and underwent  Procedures: ARTHROPLASTY, KNEE, TOTAL.    Patient was given perioperative antibiotics:  Anti-infectives (From admission, onward)    Start     Dose/Rate Route Frequency Ordered Stop   07/18/24 2000  ceFAZolin  (ANCEF ) IVPB 2g/100 mL premix        2 g 200 mL/hr over 30 Minutes Intravenous Every 6 hours 07/18/24 1827 07/19/24 1523   07/18/24 1215  ceFAZolin  (ANCEF ) IVPB 2g/100 mL premix        2 g 200 mL/hr over 30 Minutes Intravenous On call to O.R. 07/18/24 1206 07/18/24 1416        Patient was given sequential compression devices, early ambulation, and chemoprophylaxis to prevent DVT.  Inpatient Morphine Milligram Equivalents Per Day 1/27 - 1/28   Values displayed are in units of MME/Day    Order Start / End Date Yesterday Today    oxyCODONE  (Oxy IR/ROXICODONE ) immediate release tablet 5 mg 1/27 - 1/27 7.5 of Unknown --    oxyCODONE  (ROXICODONE ) 5 MG/5ML solution 5 mg 1/27 - 1/27 0 of Unknown --      Group total: 7.5 of Unknown     fentaNYL  (SUBLIMAZE ) injection 25-50  mcg 1/27 - 1/27 0 of 45-90 --    fentaNYL  (SUBLIMAZE ) 100 MCG/2ML injection 1/27 - 1/27 0 of Unknown --    fentaNYL  (SUBLIMAZE ) injection 50 mcg 1/27 - 1/27 15 of 15 --    fentaNYL  (SUBLIMAZE ) injection 1/27 - 1/27 *30 of 30 --    HYDROmorphone  (DILAUDID ) injection 0.5-1 mg 1/27 - No end date 0 of 20-40 0 of 80-160    oxyCODONE  (Oxy IR/ROXICODONE ) immediate release tablet 5-10 mg 1/27 - No end date 0 of 15-30 15 of 45-90    oxyCODONE  (Oxy IR/ROXICODONE ) immediate release tablet 10-15 mg 1/27 - No end date 15 of 30-45 45 of 90-135    Daily Totals  * 67.5 of Unknown (at least 155-250) 60 of 215-385  *One-Step medication  Calculation Errors     Order Type Date Details   oxyCODONE  (Oxy IR/ROXICODONE ) immediate release tablet 5 mg Ordered Dose -- Insufficient frequency information   oxyCODONE  (ROXICODONE ) 5 MG/5ML solution 5 mg Ordered Dose -- Insufficient frequency information   fentaNYL  (SUBLIMAZE ) 100 MCG/2ML injection Ordered Dose -- Frequency type could not be determined            Patient benefited maximally from hospital stay and there were no complications.    Recent vital signs: Patient Vitals for the past 24 hrs:  BP Temp Temp src Pulse Resp SpO2  07/19/24 1552 110/76 -- -- 76 18 97 %  07/19/24 1148 125/71 98 F (36.7 C) Oral 66 --  98 %  07/19/24 0729 117/68 98 F (36.7 C) Oral 71 -- 96 %  07/19/24 0628 117/66 98.2 F (36.8 C) Oral 72 16 96 %  07/19/24 0234 120/73 98.2 F (36.8 C) Oral 79 16 97 %  07/18/24 2225 126/78 98 F (36.7 C) Oral 73 16 98 %  07/18/24 1829 (!) 141/82 98 F (36.7 C) Oral 86 -- 97 %  07/18/24 1800 (!) 140/81 98.2 F (36.8 C) -- 78 17 96 %  07/18/24 1745 132/80 -- -- 76 15 97 %  07/18/24 1730 129/77 -- -- 74 13 95 %  07/18/24 1715 133/77 -- -- 70 13 95 %  07/18/24 1615 124/73 98 F (36.7 C) -- 62 11 96 %     Recent laboratory studies:  Recent Labs    07/19/24 0833  WBC 15.3*  HGB 12.2  HCT 33.1*  PLT 281  NA 136  K 4.3  CL 100  CO2  27  BUN 13  CREATININE 0.73  GLUCOSE 138*  CALCIUM 9.0     Discharge Medications:   Allergies as of 07/19/2024   No Known Allergies      Medication List     TAKE these medications    acetaminophen  500 MG tablet Commonly known as: TYLENOL  Take 1,000 mg by mouth daily.   acyclovir  200 MG capsule Commonly known as: ZOVIRAX  Take 4 capsules (800 mg total) by mouth 2 (two) times daily. Take for 5 days as needed for flares   Aspirin  Low Dose 81 MG chewable tablet Generic drug: aspirin  Chew 1 tablet (81 mg total) by mouth 2 (two) times daily.   calcium carbonate 1250 (500 Ca) MG chewable tablet Commonly known as: OS-CAL Chew 1 tablet by mouth daily.   Centrum Silver Women 50+ Tabs Take 1 tablet by mouth daily.   ICY HOT EX Apply 1 Application topically daily as needed (pain).   methocarbamol  500 MG tablet Commonly known as: ROBAXIN  Take 1 tablet (500 mg total) by mouth every 6 (six) hours as needed for muscle spasms.   naproxen sodium 220 MG tablet Commonly known as: ALEVE Take 440 mg by mouth every evening.   ondansetron  4 MG disintegrating tablet Commonly known as: ZOFRAN -ODT Take 1 tablet (4 mg total) by mouth every 8 (eight) hours as needed for nausea or vomiting.   oxyCODONE  5 MG immediate release tablet Commonly known as: Oxy IR/ROXICODONE  Take 1-2 tablets (5-10 mg total) by mouth every 4 (four) hours as needed for moderate pain (pain score 4-6) (pain score 4-6).   Turmeric 500 MG Tabs Take 500 mg by mouth daily.   Vitamin D  50 MCG (2000 UT) tablet Take 2,000 Units by mouth daily.               Durable Medical Equipment  (From admission, onward)           Start     Ordered   07/18/24 1828  DME 3 n 1  Once        07/18/24 1827   07/18/24 1828  DME Walker rolling  Once       Question Answer Comment  Walker: With 5 Inch Wheels   Patient needs a walker to treat with the following condition Status post total right knee replacement       07/18/24 1827            Diagnostic Studies: DG Knee Right Port Result Date: 07/18/2024 EXAM: 1 or 2 VIEW(S) XRAY OF THE RIGHT KNEE  07/18/2024 04:27:00 PM COMPARISON: None available. CLINICAL HISTORY: Status post total right knee replacement. FINDINGS: BONES AND JOINTS: Right total knee arthroplasty noted. No acute fracture. No malalignment. No significant joint effusion. SOFT TISSUES: Anterior skin staples noted. Expected postoperative changes within overlying soft tissues. IMPRESSION: 1. Status post right total knee arthroplasty with expected postoperative soft tissue changes and anterior skin staples. Electronically signed by: Oneil Devonshire MD 07/18/2024 11:09 PM EST RP Workstation: [id number]    Disposition: Discharge disposition: 01-Home or Self Care          Contact information for follow-up providers     Vernetta Lonni GRADE, MD Follow up in 2 week(s).   Specialty: Orthopedic Surgery Contact information: [address] [place]  [place] [address] [phone]              Contact information for after-discharge care     Home Medical Care     [facility]) .   Service: Home Health Services Why: Home health has been arranged.  They will contact you to schedule an appointment within 48 hours postdischarge. Contact information: [address] [place]  [address] [phone]                      Signed: Lonni GRADE Vernetta 07/19/2024, 4:09 PM

## 2024-07-19 NOTE — Progress Notes (Signed)
 Physical Therapy Treatment Patient Details Name: Theresa Rivera MRN: [id number] DOB: Mar 11, 1961 Today's Date: 07/19/2024   History of Present Illness Pt is 64 year old presented to [facility] on  07/18/24 for rt TKR. PMH - osteoarthritis, osteoporosis    PT Comments  Pt doing very well with all mobility and therapeutic exercises. Husband supportive. Ready for dc home from PT standpoint.     If plan is discharge home, recommend the following: Assist for transportation;Help with stairs or ramp for entrance;Assistance with cooking/housework   Can travel by private vehicle        Equipment Recommendations  None recommended by PT    Recommendations for Other Services       Precautions / Restrictions Precautions Precautions: Knee Recall of Precautions/Restrictions: Intact Restrictions Weight Bearing Restrictions Per Provider Order: Yes RLE Weight Bearing Per Provider Order: Weight bearing as tolerated     Mobility  Bed Mobility Overal bed mobility: Modified Independent Bed Mobility: Supine to Sit, Sit to Supine     Supine to sit: Modified independent (Device/Increase time) Sit to supine: Modified independent (Device/Increase time)   General bed mobility comments: Uses UE's to assist moving RLE on/off bed    Transfers Overall transfer level: Modified independent Equipment used: Rolling walker (2 wheels) Transfers: Sit to/from Stand Sit to Stand: Modified independent (Device/Increase time)           General transfer comment: Min assist for power up on initial stand. Supervision to stand from commode.    Ambulation/Gait Ambulation/Gait assistance: Modified independent (Device/Increase time) Gait Distance (Feet): 225 Feet Assistive device: Rolling walker (2 wheels) Gait Pattern/deviations: Step-through pattern, Decreased stride length Gait velocity: decr Gait velocity interpretation: 1.31 - 2.62 ft/sec, indicative of limited community ambulator   General Gait  Details: Assist for safety.   Stairs Stairs: Yes Stairs assistance: Supervision Stair Management: One rail Right, Step to pattern, Sideways Number of Stairs: 2     Wheelchair Mobility     Tilt Bed    Modified Rankin (Stroke Patients Only)       Balance Overall balance assessment: No apparent balance deficits (not formally assessed)                                          Communication Communication Communication: No apparent difficulties  Cognition Arousal: Alert Behavior During Therapy: WFL for tasks assessed/performed   PT - Cognitive impairments: No apparent impairments                         Following commands: Intact      Cueing Cueing Techniques: Verbal cues  Exercises Total Joint Exercises Ankle Circles/Pumps: AROM, Both, 5 reps, Supine Quad Sets: AROM, 5 reps, Right, Supine Heel Slides: AAROM, Right, 5 reps, Supine Hip ABduction/ADduction: AAROM, Right, 5 reps, Supine Straight Leg Raises: Strengthening, Right, 5 reps, Supine Long Arc Quad: AROM, Right, 5 reps, Seated Knee Flexion: AAROM, 5 reps, Right, Seated Goniometric ROM: 0-90    General Comments        Pertinent Vitals/Pain Pain Assessment Pain Assessment: Faces Faces Pain Scale: Hurts little more Pain Location: rt knee Pain Descriptors / Indicators: Grimacing, Guarding Pain Intervention(s): Premedicated before session, Limited activity within patient's tolerance    Home Living  Prior Function            PT Goals (current goals can now be found in the care plan section) Acute Rehab PT Goals Patient Stated Goal: return to golf Progress towards PT goals: Progressing toward goals    Frequency    7X/week      PT Plan      Co-evaluation              AM-PAC PT 6 Clicks Mobility   Outcome Measure  Help needed turning from your back to your side while in a flat bed without using bedrails?: None Help  needed moving from lying on your back to sitting on the side of a flat bed without using bedrails?: None Help needed moving to and from a bed to a chair (including a wheelchair)?: None Help needed standing up from a chair using your arms (e.g., wheelchair or bedside chair)?: None Help needed to walk in hospital room?: None Help needed climbing 3-5 steps with a railing? : A Little 6 Click Score: 23    End of Session   Activity Tolerance: Patient tolerated treatment well Patient left: in bed;with call bell/phone within reach;with family/visitor present Nurse Communication: Mobility status PT Visit Diagnosis: Other abnormalities of gait and mobility (R26.89);Pain Pain - Right/Left: Right Pain - part of body: Knee     Time: 8582-8554 PT Time Calculation (min) (ACUTE ONLY): 28 min  Charges:    $Gait Training: 8-22 mins $Therapeutic Exercise: 8-22 mins PT General Charges $$ ACUTE PT VISIT: 1 Visit                     [facility] PT Acute Rehabilitation Services Office [phone]    Rodgers ORN [facility] 07/19/2024, 3:12 PM

## 2024-07-19 NOTE — Care Management Obs Status (Signed)
 MEDICARE OBSERVATION STATUS NOTIFICATION   Patient Details  Name: Theresa Rivera MRN: [id number] Date of Birth: [birth year]   Medicare Observation Status Notification Given:  Yes  Verbally reviewed observation notice with Signe Alert telephonically at [phone].  Will send a copy of this document to the patient home address.   Meiko Ives 07/19/2024, 9:14 AM

## 2024-07-19 NOTE — Anesthesia Postprocedure Evaluation (Signed)
"   Anesthesia Post Note  Patient: Theresa Rivera  Procedure(s) Performed: ARTHROPLASTY, KNEE, TOTAL (Right: Knee)     Patient location during evaluation: PACU Anesthesia Type: MAC and Spinal Level of consciousness: awake and alert Pain management: pain level controlled Vital Signs Assessment: post-procedure vital signs reviewed and stable Respiratory status: spontaneous breathing, nonlabored ventilation, respiratory function stable and patient connected to nasal cannula oxygen Cardiovascular status: blood pressure returned to baseline and stable Postop Assessment: no apparent nausea or vomiting Anesthetic complications: no   No notable events documented.           Lynwood MARLA Cornea      "

## 2024-07-19 NOTE — Progress Notes (Addendum)
 Discharge   Patient expressed verbal understanding of discharge POC.   Patient given time to ask any questions.  Additional education included in AVS.  Alert oriented in good spirits.  PIV removed. Pressure dressings intact.  All personal belongings at bedtime.  Patietnt kindly refused the ice machine.  She states, We have two of them at home, you can donate that one to someone else. Spouse at bedside.   TOC meds in hand.  Transferred to Main A.

## 2024-07-23 ENCOUNTER — Encounter: Payer: Self-pay | Admitting: Orthopaedic Surgery

## 2024-07-24 ENCOUNTER — Other Ambulatory Visit (HOSPITAL_BASED_OUTPATIENT_CLINIC_OR_DEPARTMENT_OTHER): Payer: Self-pay

## 2024-07-24 ENCOUNTER — Other Ambulatory Visit: Payer: Self-pay | Admitting: Orthopaedic Surgery

## 2024-07-24 MED ORDER — OXYCODONE HCL 5 MG PO TABS: 5.0000 mg | ORAL_TABLET | Freq: Four times a day (QID) | ORAL | 0 refills | Status: DC | PRN

## 2024-07-24 MED ORDER — OXYCODONE HCL 5 MG PO TABS
5.0000 mg | ORAL_TABLET | Freq: Four times a day (QID) | ORAL | 0 refills | Status: AC | PRN
  Filled 2024-07-24: qty 30, 4d supply, fill #0

## 2024-07-27 ENCOUNTER — Encounter: Payer: Self-pay | Admitting: Orthopaedic Surgery

## 2024-07-31 ENCOUNTER — Encounter: Admitting: Orthopaedic Surgery

## 2025-01-25 ENCOUNTER — Ambulatory Visit

## 2025-01-26 ENCOUNTER — Ambulatory Visit: Admitting: Family Medicine
